# Patient Record
Sex: Male | Born: 1990 | Race: White | Hispanic: No | Marital: Single | Smoking: Current every day smoker
Health system: Southern US, Community
[De-identification: ages and names within clinical notes are randomized; demographics above are authoritative.]

## PROBLEM LIST (undated history)

## (undated) DIAGNOSIS — J939 Pneumothorax, unspecified: Secondary | ICD-10-CM

## (undated) HISTORY — DX: Pneumothorax, unspecified: J93.9

## (undated) HISTORY — PX: FRACTURE SURGERY: SHX138

---
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
## 2012-11-14 ENCOUNTER — Emergency Department: Payer: Self-pay | Admitting: Emergency Medicine

## 2014-09-16 ENCOUNTER — Emergency Department: Payer: Self-pay | Admitting: Emergency Medicine

## 2014-10-17 NOTE — Op Note (Signed)
PATIENT NAME:  Steven Rivers, Steven Rivers MR#:  [id number] DATE OF BIRTH:  1990/11/17  DATE OF PROCEDURE:  08/17/2011  PREOPERATIVE DIAGNOSIS: Right peritonsillar abscess.   POSTOPERATIVE DIAGNOSIS: Right peritonsillar abscess.  PROCEDURE: Incision and drainage of right peritonsillar abscess.   SURGEON: Marion DownerScott Karlynn Furrow, MD  ANESTHESIA: 1% lidocaine with epinephrine 1:100,000.   INDICATIONS: The patient has a right peritonsillar abscess unresponsive to medical management.   FINDINGS: I drained about 5 mL of purulent secretions from the right peritonsillar space.   COMPLICATIONS: None.   DESCRIPTION OF PROCEDURE: After discussing the procedure with the family, the throat was first anesthetized with topical Hurricaine spray followed by injection of 1% lidocaine with epinephrine 1:100,000. Initially I aspirated just above the right tonsil with an 18-gauge needle and obtained at least 2.5 mL of purulence. The area was then opened further with an 11 blade and quite a bit more purulence, at least 2.5 to 3 mL more, was subsequently drained. A needle driver was used to bluntly probe the peritonsillar space to make sure all of the loculation was broken up and the abscess successfully drained. Bleeding was minimal. The patient tolerated the procedure quite well and felt much better after drainage of the abscess.  ____________________________ Ollen GrossPaul S. Willeen CassBennett, MD psb:slb D: 08/17/2011 21:39:13 ET     T: 08/18/2011 10:41:47 ET       JOB#: [id number] cc: Ollen GrossPaul S. Willeen CassBennett, MD, <Dictator> Sandi MealyPAUL S Trula Frede MD ELECTRONICALLY SIGNED 08/27/2011 15:44

## 2014-10-17 NOTE — Consult Note (Signed)
PATIENT NAME:  Steven Rivers, [name] MR#:  [id number] DATE OF BIRTH:  1991/04/26  DATE OF CONSULTATION:  08/17/2011  REQUESTING PHYSICIAN:  Dr Enedina FinnerGoli CONSULTING PHYSICIAN:  Ollen GrossPaul S. Willeen CassBennett, MD  REASON FOR CONSULTATION: Peritonsillar abscess.   HISTORY OF PRESENT ILLNESS: This is an 24 year old male who has a history of a sore throat for the past week, initially evaluated by his primary care with evaluation for strep throat and mono. Both of those tests were negative but he was ultimately put on amoxicillin and steroids but continued to have worsening sore throat over the past 2 to 3 days with associated fever. He has really not had a history of recurrent strep throat, just occasional mild sore throats in the past.   PAST MEDICAL HISTORY: Otherwise unremarkable. No history of asthma, diabetes.   ALLERGIES: None.   MEDICATIONS:  Amoxicillin.   SOCIAL HISTORY: He is a nonsmoker. He is here with his family.   PAST SURGICAL HISTORY: Unremarkable.    REVIEW OF SYSTEMS: He is having right ear pain and difficulty swallowing, but he is able to drink liquids adequately, just difficulty eating solid foods since this started. Has not had any nausea, vomiting, diarrhea. He is not having any shortness of breath.   PHYSICAL EXAMINATION:  VITAL SIGNS: Temperature is 97.9, pulse 93, respirations 18, blood pressure is 112/75, oxygen saturations 100%.   GENERAL: Well-developed, well-nourished male in no acute distress other than looking uncomfortable.   HEENT:  Head and face: Head is normocephalic, atraumatic. No facial skin lesions. Facial strength is normal, symmetric. Ears: External ears, ear canals, tympanic membranes are clear bilaterally. There is no middle ear effusion or infection. Nasal exam: External nose is unremarkable. Nasal cavity is clear. No purulence or polyps are seen. The septum is straight. Oral cavity and oropharynx: Teeth, lips, and gums unremarkable. Tongue and floor of mouth without lesions.  Posterior pharynx reveals swelling of the right tonsil with deviation of the uvula towards the left and obvious swelling up onto the palate.   NECK: Neck is supple with some tender, nonfluctuant, jugulodigastric adenopathy on the right with about a 2 cm right cervical lymph node. No other adenopathy is palpable. Salivary glands are soft and nontender without masses. There is no thyromegaly.   NEUROLOGIC: Cranial nerves II through XII are grossly intact.   ASSESSMENT: This patient has right peritonsillar abscess.   PLAN: We went ahead and drained the abscess here in the emergency room.  I got at least 5 mL of purulence out and was able to obtain adequate drainage with reduction of the mass effect in his pharynx. He was actually feeling better immediately after the aspiration and drainage.   PLAN: He is able to swallow liquids fine and felt better after the drainage was performed. He was sent home on Augmentin 400 mg per 5 mL 2-1/4 teaspoon p.o. b.i.d. for 10 days, Sterapred DS 6-day taper, and Lortab to 15 mL p.o. every 4 to 6 hours as needed for pain. They are giving him a gram of Rocephin and dexamethasone IV prior to discharge. I think he should be able to swallow well now that he has had this drained. He has not had any issues of recurrent tonsillitis but if there are further issues that arise or he has problems with further peritonsillar abscess we could consider a tonsillectomy in the future.   ____________________________ Ollen GrossPaul S. Willeen CassBennett, MD psb:vtd D: 08/17/2011 21:37:10 ET T: 08/18/2011 10:39:21 ET JOB#: [id number]  cc: Ollen GrossPaul S. Willeen CassBennett,  MD, <Dictator> Sandi Mealy MD ELECTRONICALLY SIGNED 08/27/2011 15:44

## 2014-11-19 ENCOUNTER — Emergency Department: Payer: Managed Care, Other (non HMO)

## 2014-11-19 ENCOUNTER — Encounter: Payer: Self-pay | Admitting: Emergency Medicine

## 2014-11-19 ENCOUNTER — Inpatient Hospital Stay
Admission: EM | Admit: 2014-11-19 | Discharge: 2014-11-24 | DRG: 201 | Disposition: A | Discharge status: Home / Self Care | Payer: Managed Care, Other (non HMO) | Attending: Surgery | Admitting: Surgery

## 2014-11-19 DIAGNOSIS — J9311 Primary spontaneous pneumothorax: Principal | ICD-10-CM | POA: Diagnosis present

## 2014-11-19 DIAGNOSIS — R079 Chest pain, unspecified: Secondary | ICD-10-CM | POA: Diagnosis present

## 2014-11-19 DIAGNOSIS — J439 Emphysema, unspecified: Secondary | ICD-10-CM | POA: Diagnosis present

## 2014-11-19 DIAGNOSIS — F17210 Nicotine dependence, cigarettes, uncomplicated: Secondary | ICD-10-CM | POA: Diagnosis present

## 2014-11-19 DIAGNOSIS — F1290 Cannabis use, unspecified, uncomplicated: Secondary | ICD-10-CM | POA: Diagnosis present

## 2014-11-19 DIAGNOSIS — J939 Pneumothorax, unspecified: Secondary | ICD-10-CM | POA: Diagnosis not present

## 2014-11-19 DIAGNOSIS — J930 Spontaneous tension pneumothorax: Secondary | ICD-10-CM

## 2014-11-19 DIAGNOSIS — K838 Other specified diseases of biliary tract: Secondary | ICD-10-CM

## 2014-11-19 LAB — CBC
HEMATOCRIT: 47.1 % (ref 40.0–52.0)
HEMATOCRIT: 42.3 % (ref 40.0–52.0)
Hemoglobin: 15.8 g/dL (ref 13.0–18.0)
Hemoglobin: 14.4 g/dL (ref 13.0–18.0)
MCH: 30.8 pg (ref 26.0–34.0)
MCH: 31 pg (ref 26.0–34.0)
MCHC: 33.7 g/dL (ref 32.0–36.0)
MCHC: 34 g/dL (ref 32.0–36.0)
MCV: 91.6 fL (ref 80.0–100.0)
MCV: 91.2 fL (ref 80.0–100.0)
PLATELETS: 258 10*3/uL (ref 150–440)
PLATELETS: 198 10*3/uL (ref 150–440)
RBC: 5.14 MIL/uL (ref 4.40–5.90)
RBC: 4.63 MIL/uL (ref 4.40–5.90)
RDW: 13 % (ref 11.5–14.5)
RDW: 12.9 % (ref 11.5–14.5)
WBC: 9.5 10*3/uL (ref 3.8–10.6)
WBC: 15.6 10*3/uL — AB (ref 3.8–10.6)

## 2014-11-19 LAB — BASIC METABOLIC PANEL
ANION GAP: 9 (ref 5–15)
BUN: 11 mg/dL (ref 6–20)
CO2: 28 mmol/L (ref 22–32)
Calcium: 9.6 mg/dL (ref 8.9–10.3)
Chloride: 102 mmol/L (ref 101–111)
Creatinine, Ser: 1.02 mg/dL (ref 0.61–1.24)
GFR calc non Af Amer: >60 mL/min (ref >60)
Glucose, Bld: 111 mg/dL — ABNORMAL HIGH (ref 65–99)
POTASSIUM: 3.6 mmol/L (ref 3.5–5.1)
Sodium: 139 mmol/L (ref 135–145)

## 2014-11-19 LAB — CREATININE, SERUM
Creatinine, Ser: 0.91 mg/dL (ref 0.61–1.24)
GFR calc non Af Amer: >60 mL/min (ref >60)

## 2014-11-19 LAB — TROPONIN I: Troponin I: <0.03 ng/mL (ref <0.031)

## 2014-11-19 MED ORDER — BUPIVACAINE HCL 0.5 % IJ SOLN
10.0000 mL | Freq: Once | INTRAMUSCULAR | Status: AC
  Administered 2014-11-19: 10 mL

## 2014-11-19 MED ORDER — ACETAMINOPHEN 325 MG PO TABS: 650.0000 mg | ORAL_TABLET | Freq: Four times a day (QID) | ORAL | Status: DC | PRN

## 2014-11-19 MED ORDER — FENTANYL CITRATE (PF) 100 MCG/2ML IJ SOLN
INTRAMUSCULAR | Status: AC
  Filled 2014-11-19: qty 2

## 2014-11-19 MED ORDER — ACETAMINOPHEN 650 MG RE SUPP: 650.0000 mg | Freq: Four times a day (QID) | RECTAL | Status: DC | PRN

## 2014-11-19 MED ORDER — MIDAZOLAM HCL 2 MG/2ML IJ SOLN
2.0000 mg | Freq: Once | INTRAMUSCULAR | Status: AC
  Administered 2014-11-19: 2 mg via INTRAVENOUS

## 2014-11-19 MED ORDER — MORPHINE SULFATE 2 MG/ML IJ SOLN
2.0000 mg | INTRAMUSCULAR | Status: DC | PRN
  Administered 2014-11-20 (×4): 2 mg via INTRAVENOUS
  Administered 2014-11-20 (×2): 4 mg via INTRAVENOUS
  Administered 2014-11-20 – 2014-11-21 (×3): 2 mg via INTRAVENOUS
  Administered 2014-11-21: 4 mg via INTRAVENOUS
  Administered 2014-11-21: 2 mg via INTRAVENOUS
  Administered 2014-11-21: 1 mg via INTRAVENOUS
  Administered 2014-11-21: 4 mg via INTRAVENOUS
  Filled 2014-11-19 (×2): qty 1
  Filled 2014-11-19: qty 2
  Filled 2014-11-19: qty 1
  Filled 2014-11-19: qty 2
  Filled 2014-11-19 (×2): qty 1
  Filled 2014-11-19: qty 2
  Filled 2014-11-19 (×2): qty 1
  Filled 2014-11-19 (×2): qty 2
  Filled 2014-11-19: qty 1

## 2014-11-19 MED ORDER — DIPHENHYDRAMINE HCL 12.5 MG/5ML PO ELIX: 12.5000 mg | ORAL_SOLUTION | Freq: Four times a day (QID) | ORAL | Status: DC | PRN

## 2014-11-19 MED ORDER — HYDROMORPHONE HCL 1 MG/ML IJ SOLN
INTRAMUSCULAR | Status: AC
  Filled 2014-11-19: qty 1

## 2014-11-19 MED ORDER — FENTANYL CITRATE (PF) 100 MCG/2ML IJ SOLN
50.0000 ug | Freq: Once | INTRAMUSCULAR | Status: AC
  Administered 2014-11-19: 50 ug via INTRAVENOUS

## 2014-11-19 MED ORDER — HYDROMORPHONE HCL 1 MG/ML IJ SOLN
1.0000 mg | Freq: Once | INTRAMUSCULAR | Status: AC
  Administered 2014-11-19: 1 mg via INTRAVENOUS

## 2014-11-19 MED ORDER — LIDOCAINE-EPINEPHRINE (PF) 1 %-1:200000 IJ SOLN
INTRAMUSCULAR | Status: AC
  Filled 2014-11-19: qty 30

## 2014-11-19 MED ORDER — BUPIVACAINE HCL (PF) 0.5 % IJ SOLN
INTRAMUSCULAR | Status: AC
  Filled 2014-11-19: qty 30

## 2014-11-19 MED ORDER — HYDROMORPHONE HCL 1 MG/ML IJ SOLN
0.5000 mg | Freq: Once | INTRAMUSCULAR | Status: AC
  Administered 2014-11-19: 0.5 mg via INTRAVENOUS

## 2014-11-19 MED ORDER — LIDOCAINE-EPINEPHRINE (PF) 2 %-1:200000 IJ SOLN
10.0000 mL | Freq: Once | INTRAMUSCULAR | Status: AC
  Administered 2014-11-19: 10 mL via INTRADERMAL

## 2014-11-19 MED ORDER — FENTANYL CITRATE (PF) 100 MCG/2ML IJ SOLN
50.0000 ug | Freq: Once | INTRAMUSCULAR | Status: AC
Start: 2014-11-19 — End: 2014-11-19
  Administered 2014-11-19: 50 ug via INTRAVENOUS

## 2014-11-19 MED ORDER — HYDROMORPHONE HCL 1 MG/ML IJ SOLN
INTRAMUSCULAR | Status: AC
  Administered 2014-11-19: 1 mg via INTRAVENOUS
  Filled 2014-11-19: qty 1

## 2014-11-19 MED ORDER — MIDAZOLAM HCL 5 MG/5ML IJ SOLN
INTRAMUSCULAR | Status: AC
  Filled 2014-11-19: qty 5

## 2014-11-19 MED ORDER — ENOXAPARIN SODIUM 40 MG/0.4ML ~~LOC~~ SOLN
40.0000 mg | SUBCUTANEOUS | Status: DC
  Administered 2014-11-20 – 2014-11-22 (×3): 40 mg via SUBCUTANEOUS
  Filled 2014-11-19 (×3): qty 0.4

## 2014-11-19 MED ORDER — DIPHENHYDRAMINE HCL 50 MG/ML IJ SOLN: 12.5000 mg | Freq: Four times a day (QID) | INTRAMUSCULAR | Status: DC | PRN

## 2014-11-19 NOTE — H&P (Signed)
Steven Rivers is an 24 y.o. male.    Chief Complaint:   Left chest pain and SOB  HPI:   24 year old smoker presents with acute onset of left chest pain while walking his dog.  No trauma reported.  No prior symptoms as such.  He smokes 1 pack of cigarettes per day as well as does cannabinoids. Denies any prior history of this. He is accompanied by his parents. A chest x-ray in the emergency room demonstrates arch left pneumothorax without midline shift. Patient denies any fevers hemoptysis anorexia loss of consciousness or fall.  History reviewed. No pertinent past medical history.  Past Surgical History  Procedure Laterality Date  . Fracture surgery      History reviewed. No pertinent family history. Social History:  reports that he has been smoking.  He has never used smokeless tobacco. He reports that he drinks alcohol. He reports that he uses illicit drugs (Marijuana).  Allergies: No Known Allergies   Review of Systems:   Review of Systems  Constitutional: Negative for fever, chills, weight loss, malaise/fatigue and diaphoresis.  Respiratory: Positive for shortness of breath. Negative for hemoptysis, sputum production and wheezing.   Cardiovascular: Negative.   Gastrointestinal: Negative.   Skin: Negative for itching and rash.  Neurological: Negative.  Negative for weakness.  Psychiatric/Behavioral: Negative.   All other systems reviewed and are negative.   Physical Exam:  Physical Exam  Constitutional: He is oriented to person, place, and time and well-developed, well-nourished, and in no distress. No distress.  HENT:  Head: Normocephalic.  Eyes: Conjunctivae are normal. Pupils are equal, round, and reactive to light.  Neck: Normal range of motion. Neck supple. No JVD present. No tracheal deviation present. No thyromegaly present.  Cardiovascular: Normal rate and regular rhythm.   Pulmonary/Chest: Effort normal. No respiratory distress. He has wheezes. He has no  rales. He exhibits no tenderness.  Left chest tube in place placed by ER staff prior to my arrival.   Small intermittent Air leak is present.  Abdominal: Soft.  Musculoskeletal: Normal range of motion.  Lymphadenopathy:    He has no cervical adenopathy.  Neurological: He is alert and oriented to person, place, and time.  Skin: Skin is dry. He is not diaphoretic.  Psychiatric: Memory, affect and judgment normal.    Blood pressure 121/74, pulse 88, temperature 98 F (36.7 C), temperature source Oral, resp. rate 14, height 5' 10"  (1.778 m), weight 63.504 kg (140 lb), SpO2 99 %.    Results for orders placed or performed during the hospital encounter of 11/19/14 (from the past 48 hour(s))  CBC     Status: None   Collection Time: 11/19/14  2:26 PM  Result Value Ref Range   WBC 9.5 3.8 - 10.6 K/uL   RBC 5.14 4.40 - 5.90 MIL/uL   Hemoglobin 15.8 13.0 - 18.0 g/dL   HCT 47.1 40.0 - 52.0 %   MCV 91.6 80.0 - 100.0 fL   MCH 30.8 26.0 - 34.0 pg   MCHC 33.7 32.0 - 36.0 g/dL   RDW 13.0 11.5 - 14.5 %   Platelets 258 150 - 440 K/uL  Troponin I     Status: None   Collection Time: 11/19/14  2:26 PM  Result Value Ref Range   Troponin I <0.03 <0.031 ng/mL    Comment:        NO INDICATION OF MYOCARDIAL INJURY.   Basic metabolic panel     Status: Abnormal   Collection Time:  11/19/14  2:26 PM  Result Value Ref Range   Sodium 139 135 - 145 mmol/L   Potassium 3.6 3.5 - 5.1 mmol/L   Chloride 102 101 - 111 mmol/L   CO2 28 22 - 32 mmol/L   Glucose, Bld 111 (H) 65 - 99 mg/dL   BUN 11 6 - 20 mg/dL   Creatinine, Ser 1.02 0.61 - 1.24 mg/dL   Calcium 9.6 8.9 - 10.3 mg/dL   GFR calc non Af Amer >60 >60 mL/min   GFR calc Af Amer >60 >60 mL/min    Comment: (NOTE) The eGFR has been calculated using the CKD EPI equation. This calculation has not been validated in all clinical situations. eGFR's persistently <60 mL/min signify possible Chronic Kidney Disease.    Anion gap 9 5 - 15   Dg Chest 2  View  11/19/2014   CLINICAL DATA:  Acute left-sided chest pain and cough  EXAM: CHEST  2 VIEW  COMPARISON:  August 17, 2011  FINDINGS: There is complete collapse of the left lung without appreciable tension component. The right lung is clear. The heart size and pulmonary vascularity are normal. No adenopathy. There is an old healed fracture of the left clavicle.  IMPRESSION: Complete pneumothorax on the left.  Critical Value/emergent results were called by telephone at the time of interpretation on 11/19/2014 at 3:24 pm to Dr. Harvest Dark , who verbally acknowledged these results.   Electronically Signed   By: Lowella Grip III M.D.   On: 11/19/2014 15:24   Dg Chest Portable 1 View  11/19/2014   CLINICAL DATA:  Status post chest tube insertion  EXAM: PORTABLE CHEST - 1 VIEW  COMPARISON:  Earlier today  FINDINGS: Interval placement of left chest tube. There has been significant improvement in the appearance of the left pneumothorax. Residual left apical component measures 1.6 cm in thickness. Atelectasis is noted in left midlung. The right lung is clear.  IMPRESSION: Significant interval re-expansion of the left lung status post chest tube insertion. There is a residual left apical pneumothorax measuring 1.6 cm.   Electronically Signed   By: Kerby Moors M.D.   On: 11/19/2014 16:31    I personally reviewed the images on PACS monitor and spoke with Dr. Rip Harbour of the emergency room regarding the patient's care. Dr. Cinda Quest placed chest tube.    Assessment/Plan 24 year old pack per day cigarette smoker and cannabinoid user presents with spontaneous left pneumothorax which has been treated with a 20 French chest tube placed by the emergency room staff. I discussed with him the need for smoking cessation as well as avoidance of deep inhalation with cannabinoids use in the future. I discussed with him the surgical plan of chest tube drainage and reexpansion of his lung with chest tube drainage  alone. I discussed with him the small risk of bilaterality in this process. As well as a small risk of recurrence. Plan is for chest tube placement continued suction follow-up chest x-ray in the morning and resolution of his air leak. I discussed with him and his parents briefly that it clearly did not resolve that he would need a thoracoscopic procedure next week.   Hortencia Conradi, MD, FACS

## 2014-11-19 NOTE — ED Notes (Signed)
Pt in bed with father at bedside, pt with c/o chest pain at CT site.

## 2014-11-19 NOTE — ED Notes (Signed)
Patient reports some increased muscle type pain to left rib area, discussed that this is likely related to placement of chest tube. Patient reports no increased shortness of breath, oxygen saturations WNL. Encouraged patient to call if pain worsens. Dr. Darnelle CatalanMalinda aware.

## 2014-11-19 NOTE — ED Notes (Signed)
Pt in bed with no complaints, family at bedside.

## 2014-11-19 NOTE — ED Provider Notes (Addendum)
[facility] Emergency Department Provider Note  ____________________________________________  Time seen: Approximately 4:14 PM  I have reviewed the triage vital signs and the nursing notes.   HISTORY  Chief Complaint Chest Pain and Back Pain   HPI Steven Rivers is a 24 y.o. male patient reports he got up was fine this morning and began to walk the dog around 1:00 developed sudden onset of chest pain and back pain worse with movement or coughing. Patient got somewhat short of breath. Came into the emergency room  Reports he's never had this before he has no past medical history no allergies and no other problems History reviewed. No pertinent past medical history.  There are no active problems to display for this patient.   Past Surgical History  Procedure Laterality Date  . Fracture surgery      No current outpatient prescriptions on file.  Allergies Review of patient's allergies indicates no known allergies.  No family history on file.  Social History History  Substance Use Topics  . Smoking status: Current Every Day Smoker  . Smokeless tobacco: Never Used  . Alcohol Use: Yes    Review of Systems Constitutional: No fever/chills Eyes: No visual changes. ENT: No sore throat. Cardiovascular: Sided chest pain Respiratory:shortness of breath. Gastrointestinal: No abdominal pain.  No nausea, no vomiting.  No diarrhea.  No constipation. Genitourinary: Negative for dysuria. Musculoskeletal: Negative for back pain. Skin: Negative for rash. Neurological: Negative for headaches, focal weakness or numbness.  10-point ROS otherwise negative.  ____________________________________________   PHYSICAL EXAM:  VITAL SIGNS: ED Triage Vitals  Enc Vitals Group     BP 11/19/14 1433 141/81 mmHg     Pulse Rate 11/19/14 1433 112     Resp 11/19/14 1433 18     Temp 11/19/14 1433 98 F (36.7 C)     Temp Source 11/19/14 1433 Oral     SpO2 11/19/14  1433 98 %     Weight 11/19/14 1433 140 lb (63.504 kg)     Height 11/19/14 1433  (1.778 m)     Head Cir --      Peak Flow --      Pain Score 11/19/14 1434 10     Pain Loc --      Pain Edu? --      Excl. in GC? --     Constitutional: Alert and oriented. Eyes: Conjunctivae are normal. PERRL. EOMI. Head: Atraumatic. Nose: No congestion/rhinnorhea. Mouth/Throat: Mucous membranes are moist.  Oropharynx non-erythematous. Neck: No stridor. Trachea is midline  Cardiovascular: Normal rate, regular rhythm. Grossly normal heart sounds.  Good peripheral circulation. Respiratory: Normal respiratory effort.  No retractions. Right lung is clear there are no breath sounds on the left is some tympany on the left however Gastrointestinal: Soft and nontender. No distention. No abdominal bruits. No CVA tenderness. Musculoskeletal: No lower extremity tenderness nor edema.  No joint effusions. Neurologic:  Normal speech and language. No gross focal neurologic deficits are appreciated. Speech is normal. No gait instability. Skin:  Skin is warm, dry and intact. No rash noted. Psychiatric: Mood and affect are normal. Speech and behavior are normal.  ____________________________________________   LABS (all labs ordered are listed, but only abnormal results are displayed)  Labs Reviewed  BASIC METABOLIC PANEL - Abnormal; Notable for the following:    Glucose, Bld 111 (*)    All other components within normal limits  CBC  TROPONIN I   ____________________________________________  EKG EKG read by me  says shows normal sinus rhythm at a rate of 100 normal axis no acute ST-T wave changes patient normal EKG ____________________________________________  RADIOLOGY  Chest x-ray read by me shows a massive left-sided pneumothorax with complete collapse of the left lung does not appear to be tension yet at this point ____________________________________________   PROCEDURES  Procedure(s) performed:  Consent was obtained patient prepped in usual sterile fashion patient was given Versed and fentanyl 50 mg that no one 2 mg of Versed twice IV in the left chest midaxillary line about the fifth interspace was anesthetized with a 50-50 mixture of 10 cc of 1% lidocaine with epi and bupivacaine late large wheal was raised. Skin was incised with a #11 blade and then a 20 French chest tube over trocar was inserted easily into the incision patient coughed and there were several large rushes of air every time he coughed coming out of the chest tube there was no bleeding. Tube was sewn into place and a pursestring suture was left in place around the chest tube site to be used if needed patient tolerated procedure quite well  Critical Care performed: No  ____________________________________________   INITIAL IMPRESSION / ASSESSMENT AND PLAN / ED COURSE  Pertinent labs & imaging results that were available during my care of the patient were reviewed by me and considered in my medical decision making (see chart for details).   ____________________________________________   FINAL CLINICAL IMPRESSION(S) / ED DIAGNOSES  Final diagnoses:  Pneumothorax     Arnaldo NatalPaul F Skilynn Durney, MD 11/19/14 1621  EP chest x-ray shows significant reexpansion lung small apical pneumothorax remaining and some atelectasis in the midlung patient continues to complain of some pain in the left chest but this is under control with pain meds  Arnaldo NatalPaul F Dellene Mcgroarty, MD 11/19/14 [phone]

## 2014-11-19 NOTE — ED Notes (Signed)
Transparent gauze and paper tape dressing placed around chest tube by Dr. Darnelle CatalanMalinda. Patient tolerating well.

## 2014-11-19 NOTE — ED Notes (Signed)
Chest tube properly working at this time

## 2014-11-19 NOTE — ED Notes (Signed)
Patient reports sudden onset of chest pain and back pain today while walking dog. Patient reports diaphoresis and nausea.

## 2014-11-20 ENCOUNTER — Inpatient Hospital Stay: Payer: Managed Care, Other (non HMO)

## 2014-11-20 MED ORDER — PROMETHAZINE HCL 25 MG PO TABS
12.5000 mg | ORAL_TABLET | ORAL | Status: DC | PRN
  Administered 2014-11-21 – 2014-11-23 (×3): 12.5 mg via ORAL
  Filled 2014-11-20 (×3): qty 1

## 2014-11-20 MED ORDER — MORPHINE SULFATE 4 MG/ML IJ SOLN
8.0000 mg | Freq: Once | INTRAMUSCULAR | Status: AC
Start: 2014-11-20 — End: 2014-11-20
  Administered 2014-11-20: 8 mg via INTRAVENOUS
  Filled 2014-11-20: qty 2

## 2014-11-20 MED ORDER — HYDROCODONE-ACETAMINOPHEN 5-325 MG PO TABS
1.0000 | ORAL_TABLET | ORAL | Status: DC | PRN
  Administered 2014-11-20 (×2): 1 via ORAL
  Administered 2014-11-21 – 2014-11-22 (×5): 2 via ORAL
  Filled 2014-11-20: qty 2
  Filled 2014-11-20: qty 1
  Filled 2014-11-20 (×3): qty 2
  Filled 2014-11-20: qty 1
  Filled 2014-11-20: qty 2

## 2014-11-20 MED ORDER — PROMETHAZINE HCL 25 MG/ML IJ SOLN
12.5000 mg | INTRAMUSCULAR | Status: DC | PRN
  Administered 2014-11-20 – 2014-11-21 (×3): 12.5 mg via INTRAVENOUS
  Filled 2014-11-20 (×3): qty 1

## 2014-11-20 MED ORDER — OXYCODONE-ACETAMINOPHEN 5-325 MG PO TABS
1.0000 | ORAL_TABLET | ORAL | Status: DC | PRN
  Administered 2014-11-21: 2 via ORAL
  Administered 2014-11-21: 1 via ORAL
  Administered 2014-11-22 – 2014-11-24 (×10): 2 via ORAL
  Filled 2014-11-20 (×6): qty 2
  Filled 2014-11-20: qty 1
  Filled 2014-11-20 (×5): qty 2

## 2014-11-20 NOTE — Progress Notes (Signed)
[facility]   PATIENT NAME: Steven Hauserlijah Golden    MR#:  [id number]  DATE OF BIRTH:  07/12/1990  SUBJECTIVE:   No pain,  Poor cough,  Dipping smokeless tobacco. CXR shows small apical PTX,  Xray taken off suction (-20 CM).  REVIEW OF SYSTEMS:   Review of Systems  Constitutional: Negative.   Cardiovascular: Positive for chest pain.  All other systems reviewed and are negative.   DRUG ALLERGIES:  No Known Allergies  VITALS:  Blood pressure 121/71, pulse 60, temperature 98.4 F (36.9 C), temperature source Oral, resp. rate 17, height 5\' 10"  (1.778 m), weight 58.196 kg (128 lb 4.8 oz), SpO2 100 %.  PHYSICAL EXAMINATION:  GENERAL:  24 y.o.-year-old patient lying in the bed with no acute distress.  EYES: Pupils equal, round, reactive to light and accommodation. No scleral icterus. Extraocular muscles intact.  HEENT: Head atraumatic, normocephalic. Oropharynx and nasopharynx clear.  NECK:  Supple, no jugular venous distention. No thyroid enlargement, no tenderness.  LUNGS: Normal breath sounds bilaterally, no wheezing, rales,rhonchi or crepitation. No use of accessory muscles of respiration. Small air leak noted,  CARDIOVASCULAR: S1, S2 normal. No murmurs, rubs, or gallops.  ABDOMEN: Soft, nontender, nondistended. Bowel sounds present. No organomegaly or mass.  EXTREMITIES: No pedal edema, cyanosis, or clubbing.  NEUROLOGIC: Cranial nerves II through XII are intact. Muscle strength 5/5 in all extremities. Sensation intact. Gait not checked.  PSYCHIATRIC: The patient is alert and oriented x 3.  SKIN: No obvious rash, lesion, or ulcer.     ASSESSMENT AND PLAN:   I personally reviewed the chest x-ray images on the PACS monitor. There is a small left apical pneumothorax. Chest tube in good position.  24 year old male with tobacco abuse and dependence in addition to smokeless tobacco dependence with spontaneous left pneumothorax. Small apical pneumothorax is noted. Chest tube  suction was increased from -20 to -30 at the bedside. We will recheck an x-ray in the morning and asked Dr. Thelma Bargeaks to see the patient on Monday.  He voiced understanding.

## 2014-11-20 NOTE — Progress Notes (Signed)
Dr. Anda KraftMarterre notified of pt having increased pain at chest tube site even after receiving morphine 30 min ago and "squealing" noise heard at chest tube site as well. Continues to have bubbling in chest tube as well with suction increased to 30cm. MD stated he will come see patient.

## 2014-11-20 NOTE — Progress Notes (Signed)
Asked Dr. Egbert GaribaldiBird about patient being able to ambulate without suction to the chest tube.  He said that was ok but only for a short time, must stay on the floor

## 2014-11-20 NOTE — Progress Notes (Signed)
Persistent air leak with squeak at the chest tube insertion site at the skin. I removed the dressing and replaced it. Although the squeak is markedly improved, there is still a small squeak within the tubing, but it is not from the tubing itself and therefore must be from the lung. Percocet and Norco prescribed for pain.

## 2014-11-21 ENCOUNTER — Inpatient Hospital Stay: Payer: Managed Care, Other (non HMO)

## 2014-11-21 MED ORDER — MORPHINE SULFATE 4 MG/ML IJ SOLN
5.0000 mg | INTRAMUSCULAR | Status: DC | PRN
Start: 2014-11-21 — End: 2014-11-22
  Administered 2014-11-21: 2 mg via INTRAVENOUS
  Administered 2014-11-22: 8 mg via INTRAVENOUS
  Administered 2014-11-22 (×2): 4 mg via INTRAVENOUS
  Filled 2014-11-21: qty 1
  Filled 2014-11-21: qty 2
  Filled 2014-11-21: qty 1

## 2014-11-21 MED ORDER — MORPHINE SULFATE 4 MG/ML IJ SOLN
INTRAMUSCULAR | Status: AC
  Filled 2014-11-21: qty 1

## 2014-11-21 NOTE — Progress Notes (Signed)
Notified Dr Anda KraftMarterre of pt pain severe sharpe stabbing pain left flank-chest tube, blood pressure elevated. No change in lung sounds or CT status. New medication parameters ordered.

## 2014-11-21 NOTE — Progress Notes (Signed)
[facility]   PATIENT NAME: Steven Rivers    MR#:  [id number]  DATE OF BIRTH:  Jun 27, 1990  SUBJECTIVE:   He was sleeping. Last night's air leak was noted by Dr. Anda KraftMarterre.  Today's chest x-ray demonstrates diminished pneumothorax. He is continuing to have an air leak. REVIEW OF SYSTEMS:   Review of Systems  Constitutional: Negative for fever and chills.  Respiratory: Negative for cough, hemoptysis, sputum production and shortness of breath.   Cardiovascular: Negative for chest pain and palpitations.  Skin: Negative for rash.  Neurological: Negative for headaches.  All other systems reviewed and are negative.   DRUG ALLERGIES:  No Known Allergies  VITALS:  Blood pressure 110/62, pulse 64, temperature 98.3 F (36.8 C), temperature source Oral, resp. rate 18, height 5\' 10"  (1.778 m), weight 58.196 kg (128 lb 4.8 oz), SpO2 99 %.  PHYSICAL EXAMINATION:  GENERAL:  24 y.o.-year-old patient lying in the bed with no acute distress.  EYES: Pupils equal, round, reactive to light and accommodation. No scleral icterus. Extraocular muscles intact.  HEENT: Head atraumatic, normocephalic. Oropharynx and nasopharynx clear.  NECK:  Supple, no jugular venous distention. No thyroid enlargement, no tenderness.  LUNGS: Normal breath sounds bilaterally, no wheezing, rales,rhonchi or crepitation. No use of accessory muscles of respiration. There is a small intermittent air leak. Left chest tube dressing appears to be intact with no subcutaneous emphysema noted. CARDIOVASCULAR: S1, S2 normal. No murmurs, rubs, or gallops.  ABDOMEN: Soft, nontender, nondistended. Bowel sounds present. No organomegaly or mass.  EXTREMITIES: No pedal edema, cyanosis, or clubbing.  NEUROLOGIC: Cranial nerves II through XII are intact. Muscle strength 5/5 in all extremities. Sensation intact. Gait not checked.  PSYCHIATRIC: The patient is alert and oriented x 3.  SKIN: No obvious rash, lesion, or ulcer.      ASSESSMENT AND PLAN:   I personally reviewed the chest x-ray from today and yesterday. There is a tiny left apical pneumothorax no subcutaneous emphysema noted. The chest tube was in good position.  Hospital day #2 status post spontaneous left pneumothorax and chest tube placement by the emergency room staff. We will obtain thoracic surgery consultation Monday. I will decrease his suction to -20 cm of wall suction at this time. Discussed briefly the possibility of needing operative intervention given the persistent air leak. Again I discussed the importance of smoking cessation.

## 2014-11-22 ENCOUNTER — Inpatient Hospital Stay: Payer: Managed Care, Other (non HMO)

## 2014-11-22 LAB — COMPREHENSIVE METABOLIC PANEL
ALK PHOS: 76 U/L (ref 38–126)
ALT: 13 U/L — ABNORMAL LOW (ref 17–63)
ANION GAP: 6 (ref 5–15)
AST: 19 U/L (ref 15–41)
Albumin: 4.1 g/dL (ref 3.5–5.0)
BILIRUBIN TOTAL: 0.2 mg/dL — AB (ref 0.3–1.2)
BUN: 10 mg/dL (ref 6–20)
CHLORIDE: 96 mmol/L — AB (ref 101–111)
CO2: 30 mmol/L (ref 22–32)
CREATININE: 0.94 mg/dL (ref 0.61–1.24)
Calcium: 8.5 mg/dL — ABNORMAL LOW (ref 8.9–10.3)
GFR calc Af Amer: >60 mL/min (ref >60)
GFR calc non Af Amer: >60 mL/min (ref >60)
Glucose, Bld: 75 mg/dL (ref 65–99)
POTASSIUM: 3.7 mmol/L (ref 3.5–5.1)
Sodium: 132 mmol/L — ABNORMAL LOW (ref 135–145)
Total Protein: 6.8 g/dL (ref 6.5–8.1)

## 2014-11-22 LAB — CBC WITH DIFFERENTIAL/PLATELET
BASOS ABS: 0.1 10*3/uL (ref 0–0.1)
BASOS PCT: 1 %
EOS PCT: 4 %
Eosinophils Absolute: 0.3 10*3/uL (ref 0–0.7)
HCT: 41.9 % (ref 40.0–52.0)
Hemoglobin: 14.4 g/dL (ref 13.0–18.0)
LYMPHS ABS: 3.1 10*3/uL (ref 1.0–3.6)
LYMPHS PCT: 42 %
MCH: 31.2 pg (ref 26.0–34.0)
MCHC: 34.4 g/dL (ref 32.0–36.0)
MCV: 90.6 fL (ref 80.0–100.0)
Monocytes Absolute: 0.5 10*3/uL (ref 0.2–1.0)
Monocytes Relative: 7 %
NEUTROS PCT: 46 %
Neutro Abs: 3.5 10*3/uL (ref 1.4–6.5)
Platelets: 178 10*3/uL (ref 150–440)
RBC: 4.62 MIL/uL (ref 4.40–5.90)
RDW: 12.9 % (ref 11.5–14.5)
WBC: 7.4 10*3/uL (ref 3.8–10.6)

## 2014-11-22 LAB — PROTIME-INR
INR: 0.98
Prothrombin Time: 13.2 seconds (ref 11.4–15.0)

## 2014-11-22 LAB — APTT: APTT: 34 s (ref 24–36)

## 2014-11-22 MED ORDER — MORPHINE SULFATE 4 MG/ML IJ SOLN
4.0000 mg | INTRAMUSCULAR | Status: DC | PRN
  Administered 2014-11-21: 22:00:00 via INTRAVENOUS
  Administered 2014-11-22: 6 mg via INTRAVENOUS
  Administered 2014-11-22: 4 mg via INTRAVENOUS
  Administered 2014-11-22: 6 mg via INTRAVENOUS
  Administered 2014-11-22 – 2014-11-23 (×4): 4 mg via INTRAVENOUS
  Administered 2014-11-23: 6 mg via INTRAVENOUS
  Administered 2014-11-23 (×2): 4 mg via INTRAVENOUS
  Administered 2014-11-23: 6 mg via INTRAVENOUS
  Filled 2014-11-22: qty 1
  Filled 2014-11-22: qty 2
  Filled 2014-11-22: qty 1
  Filled 2014-11-22: qty 2
  Filled 2014-11-22: qty 1
  Filled 2014-11-22: qty 2
  Filled 2014-11-22: qty 1
  Filled 2014-11-22: qty 2
  Filled 2014-11-22 (×3): qty 1

## 2014-11-22 MED ORDER — CEFAZOLIN SODIUM-DEXTROSE 2-3 GM-% IV SOLR
2.0000 g | INTRAVENOUS | Status: AC
  Filled 2014-11-22: qty 50

## 2014-11-22 NOTE — Progress Notes (Signed)
Subjective:   Sleeping no new symptoms.  Vital signs in last 24 hours: Temp:  [97.8 F (36.6 C)-98.3 F (36.8 C)] 97.8 F (36.6 C) (05/30 0844) Pulse Rate:  [59-76] 59 (05/30 0844) Resp:  [16-20] 18 (05/30 0008) BP: (112-158)/(61-89) 112/64 mmHg (05/30 0844) SpO2:  [100 %] 100 % (05/30 0844) Last BM Date: 11/21/14  Intake/Output from previous day: 05/29 0701 - 05/30 0700 In: 580 [P.O.:580] Out: 1610 [Urine:1575; Chest Tube:35]  Exam:  Continues to have chest pain.  Lab Results:  CBC  Recent Labs  11/19/14 1426 11/19/14 2306  WBC 9.5 15.6*  HGB 15.8 14.4  HCT 47.1 42.3  PLT 258 198   CMP     Component Value Date/Time   NA 139 11/19/2014 1426   NA 143* 08/17/2011 1957   K 3.6 11/19/2014 1426   K 4.0 08/17/2011 1957   CL 102 11/19/2014 1426   CL 102 08/17/2011 1957   CO2 28 11/19/2014 1426   CO2 31* 08/17/2011 1957   GLUCOSE 111* 11/19/2014 1426   GLUCOSE 83 08/17/2011 1957   BUN 11 11/19/2014 1426   BUN 6* 08/17/2011 1957   CREATININE 0.91 11/19/2014 2306   CREATININE 0.83 08/17/2011 1957   CALCIUM 9.6 11/19/2014 1426   CALCIUM 8.6* 08/17/2011 1957   GFRNONAA >60 11/19/2014 2306   GFRAA >60 11/19/2014 2306   PT/INR No results for input(s): LABPROT, INR in the last 72 hours.  Studies/Results: Dg Chest 1 View  11/21/2014   CLINICAL DATA:  Followup pneumothorax.  EXAM: CHEST  1 VIEW  COMPARISON:  11/20/2014.  FINDINGS: Left pneumothorax is decreased in size, follow-up measuring 14 mm from the apical parietal pleural margin where it had measured 21 mm.  Left chest tube is stable.  Lungs are hyperexpanded. No lung consolidation or edema. No pleural effusion.  IMPRESSION: 1. Small left apical pneumothorax is decreased in size from the previous day's study. No other change.   Electronically Signed   By: Amie Portlandavid  Ormond M.D.   On: 11/21/2014 08:31    Assessment/Plan: Moderate air leak persists. Chest x-rays pending.

## 2014-11-22 NOTE — Progress Notes (Addendum)
Steven Marin, MD Patient ID: Steven Rivers, male   DOB: 09/13/1990, 24 y.o.   MRN: [id number]  Chief Complaint  Patient presents with  . Chest Pain  . Back Pain    HPI Location, Quality, Duration, Severity, Timing, Context, Modifying Factors, Associated Signs and Symptoms.  Steven Rivers is a 24 y.o. male.  I have been asked to see this patient in consultation regarding his left spontaneous pneumothorax by Dr. Natale Lay. This patient is a 24 year old white male who was walking his dog down the steps when he developed the acute onset of left-sided chest pain with shortness of breath. The pain was severe and it was associated with sweating. There was no nausea or vomiting fevers or chills. There is no cough or sputum production. The patient presented to the emergency department where a chest x-ray was performed. This revealed a left-sided pneumothorax which is managed with a tube thoracostomy. The patient states that he had prompt relief of the shortness of breath but continues to have pain at the chest tube insertion site. Over the last several days he has had a moderate sized air leak and his chest x-rays have confirmed a small apical pneumothorax which has been persistent. He has been tried on various degrees of suction and still his air leak persists. He states that he is not short of breath at the present time but does have some chest discomfort. This is made worse with coughing and sneezing. There are no other associated symptoms at this point. He states that he has had chest pain off and on over the years but never like this and it has always lasted only a few minutes at most. There is no family history of lung disease although his grandfather had lung cancer. He denies any prior pneumothorax. These had some orthopedic surgical procedures but has never had any chest tubes or lung surgery.  History reviewed. No pertinent past medical history.  Past Surgical History  Procedure Laterality Date   . Fracture surgery      History reviewed. No pertinent family history.  Social History History  Substance Use Topics  . Smoking status: Current Every Day Smoker  . Smokeless tobacco: Never Used  . Alcohol Use: Yes    No Known Allergies  Current Facility-Administered Medications  Medication Dose Route Frequency Provider Last Rate Last Dose  . acetaminophen (TYLENOL) tablet 650 mg  650 mg Oral Q6H PRN Duwaine Maxin, MD       Or  . acetaminophen (TYLENOL) suppository 650 mg  650 mg Rectal Q6H PRN Duwaine Maxin, MD      . diphenhydrAMINE (BENADRYL) injection 12.5 mg  12.5 mg Intravenous Q6H PRN Duwaine Maxin, MD       Or  . diphenhydrAMINE (BENADRYL) 12.5 MG/5ML elixir 12.5 mg  12.5 mg Oral Q6H PRN Duwaine Maxin, MD      . enoxaparin (LOVENOX) injection 40 mg  40 mg Subcutaneous Q24H Duwaine Maxin, MD   40 mg at 11/22/14 1051  . HYDROcodone-acetaminophen (NORCO/VICODIN) 5-325 MG per tablet 1-2 tablet  1-2 tablet Oral Q4H PRN Duwaine Maxin, MD   2 tablet at 11/22/14 1048  . morphine 4 MG/ML injection 5-10 mg  5-10 mg Intravenous Q1H PRN Duwaine Maxin, MD   4 mg at 11/22/14 0836  . oxyCODONE-acetaminophen (PERCOCET/ROXICET) 5-325 MG per tablet 1-2 tablet  1-2 tablet Oral Q4H PRN Duwaine Maxin, MD   2 tablet at 11/22/14 [id number]  . promethazine (PHENERGAN) tablet 12.5 mg  12.5 mg  Oral Q3H PRN Duwaine MaxinWilliam Marterre, MD   12.5 mg at 11/21/14 1943   Or  . promethazine (PHENERGAN) injection 12.5 mg  12.5 mg Intravenous Q3H PRN Duwaine MaxinWilliam Marterre, MD   12.5 mg at 11/21/14 0725      Review of Systems A 10 point review of systems was asked and was negative except for the following positive findings - none except for pain at the chest tube insertion site.  Blood pressure 112/64, pulse 59, temperature 97.8 F (36.6 C), temperature source Oral, resp. rate 18, height 5\' 10"  (1.778 m), weight 58.196 kg (128 lb 4.8 oz), SpO2 100 %.  Physical Exam CONSTITUTIONAL:  Pleasant,  well-developed, well-nourished, and in no acute distress. EYES: Pupils equal and reactive to light, Sclera non-icteric EARS, NOSE, MOUTH AND THROAT:  The oropharynx was clear.  Dentition is good repair.  Oral mucosa pink and moist. LYMPH NODES:  Lymph nodes in the neck and axillae were normal RESPIRATORY:  Lungs were clear.  Normal respiratory effort without pathologic use of accessory muscles of respiration CARDIOVASCULAR: Heart was regular without murmurs.  There were no carotid bruits. GI: The abdomen was soft, nontender, and nondistended. There were no palpable masses. There was no hepatosplenomegaly. There were normal bowel sounds in all quadrants. GU:  Rectal deferred.   MUSCULOSKELETAL:  Normal muscle strength and tone.  No clubbing or cyanosis.   SKIN:  There were no pathologic skin lesions.  There were no nodules on palpation.  There is a bulky dressing present overlying the chest tube. The patient does have multiple tattoos. NEUROLOGIC:  Cranial nerves are grossly intact. PSYCH:  Oriented to person, place and time.  Mood and affect are normal.  Data Reviewed I have personally reviewed the patient's chest x-rays.  I have personally reviewed the patient's imaging, laboratory findings and medical records.    Assessment    I had a long discussion with the patient and his father today. His girlfriend was also present. Patient is quite desirous of discharge from the hospital soon as possible. I reviewed with him the most likely etiology including apical bleb disease given his young age. In fact on the chest x-ray I do believe that I can see some of the blebs. There is no indication of any other pathology present. 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    Plan    I spent a long time with the patient and his father today reviewing the options. I told them that for the first occurrence we usually manages this with a chest tube alone. However  he continues to have a moderate sized air leak and is quite anxious have this taken care of in a more definitive fashion and in a timely fashion. I explained to him the indications and risks of thoracoscopy possible thoracotomy with blebectomy and mechanical pleurodesis. I quoted them a 10% recurrence rate with chest tube alone versus a 3-4% recurrence rate with blebectomy and mechanical pleurodesis. We also reviewed the postoperative management including pain management and his ability to get back to work. He does not work in a high risk profession such as the [employer] industry or scuba diving. Therefore I believe he could get back to work within 2 weeks of discharge. We will make a further assessment in the morning. If he continues to have an air leak and is desirous of surgical intervention we will try to perform this as soon as possible.      Steven Rivers  Steven Rivers 11/22/2014, 11:03 AM

## 2014-11-23 ENCOUNTER — Encounter
Admission: EM | Disposition: A | Discharge status: Home / Self Care | Payer: Self-pay | Source: Home / Self Care | Attending: Surgery

## 2014-11-23 ENCOUNTER — Inpatient Hospital Stay: Payer: Managed Care, Other (non HMO)

## 2014-11-23 SURGERY — VIDEO ASSISTED THORACOSCOPY (VATS)/THOROCOTOMY
Anesthesia: General | Laterality: Left

## 2014-11-23 NOTE — Progress Notes (Signed)
Patient ID: Steven Rivers, male   DOB: 1990/11/01, 24 y.o.   MRN: [id number]  HISTORY: This patient was seen today on morning rounds. At that time he complained of pain at the chest tube insertion site. However there is no air leak visible today.   PERTINENT REVIEW OF SYSTEMS: He denied any shortness of breath. He denied any fevers, cough or chills.  Filed Vitals:   11/23/14 1612  BP: 122/70  Pulse: 58  Temp: 98.2 F (36.8 C)  Resp: 20   Wt Readings from Last 3 Encounters:  11/20/14 58.196 kg (128 lb 4.8 oz)    EXAM:    Resp: Lungs are clear bilaterally.  No respiratory distress, normal effort. Heart:  Regular without murmurs Abd:  Abdomen is soft, non distended and non tender. No masses are palpable.  There is no rebound and no guarding.  Skin: Skin is warm and dry. No rash noted. No diaphoretic. No erythema. No pallor.  Psychiatric: Normal mood and affect. Normal behavior. Judgment and thought content normal.   No air leak seen in system even with vigorous cough     ASSESSMENT: I have independently reviewed the patient's CT scan. There is a small residual apical pneumothorax. There are apical blebs in the left lung. I again reviewed with him the options at this point in time. I believe he has a spontaneous pneumothorax secondary to bleb disease. Because there is no air leak he would like to hold off on surgery and I concur with that. We'll go ahead and allow the patient to eat today.   PLAN:   I will keep the patient's chest tube on suction today and place the tube to waterseal tomorrow. The tube may be removed tomorrow afternoon. I will obtain a chest x-ray once the tube is out.    Hulda Marinimothy Quron Ruddy, MD

## 2014-11-24 ENCOUNTER — Inpatient Hospital Stay: Payer: Managed Care, Other (non HMO)

## 2014-11-24 DIAGNOSIS — J939 Pneumothorax, unspecified: Secondary | ICD-10-CM

## 2014-11-24 MED ORDER — OXYCODONE-ACETAMINOPHEN 5-325 MG PO TABS: 1.0000 | ORAL_TABLET | Freq: Four times a day (QID) | ORAL | Status: DC | PRN

## 2014-11-24 NOTE — Progress Notes (Signed)
Patient ID: Steven Rivers, male   DOB: Aug 23, 1990, [age] y.o.   MRN: [id number]  HISTORY: Some pain at chest tube site.  Improving.  Not short of breath   PERTINENT REVIEW OF SYSTEMS: No shortness of breath.  No fever.\  Filed Vitals:   11/24/14 0819  BP: 112/68  Pulse: 69  Temp: 98.1 F (36.7 C)  Resp:    Wt Readings from Last 3 Encounters:  11/20/14 58.196 kg (128 lb 4.8 oz)    EXAM:  Resp: Lungs are clear bilaterally.  No respiratory distress, normal effort. Heart:  Regular without murmurs Abd:  Abdomen is soft, non distended and non tender. No masses are palpable.  There is no rebound and no guarding.  Psychiatric: Normal mood and affect. Normal behavior. Judgment and thought content normal.      ASSESSMENT: Independent review of CT and CXRay show a small apical pneumothorax on left.  No air leak with vigorous cough  PLAN:   I have clamped the chest tube and will obtain another chest xray in 4 hours.  Possible discharge today    Hulda Marinimothy Aspen Deterding, MD

## 2014-11-24 NOTE — Progress Notes (Signed)
Patient discharged to home as ordered, prescription and follow up appt given to patient as ordered. Patient is alert and oriented VSS No acute distress noted. Dr. Thelma Bargeaks followed up on XRay and release patient to be discharged to home.

## 2014-11-24 NOTE — Discharge Summary (Signed)
Physician Discharge Summary  Patient ID: Steven Rivers MRN: [id number] DOB/AGE: 07-11-1990 23 y.o.  Admit date: 11/19/2014 Discharge date: 11/24/2014  Admission Diagnoses:  Discharge Diagnoses:  Principal Problem:   Primary spontaneous pneumothorax Active Problems:   Pneumothorax    Hospital Course: Admitted with left spontaneous managed with chest tube.  CT showed a small left apical bleb.   Discharge Exam: Blood pressure 112/68, pulse 69, temperature 98.1 F (36.7 C), temperature source Oral, resp. rate 18, height 5\' 10"  (1.778 m), weight 58.196 kg (128 lb 4.8 oz), SpO2 100 %.   Disposition: Final discharge disposition not confirmed  Discharge Instructions    Call MD for:  difficulty breathing, headache or visual disturbances    Complete by:  As directed      Call MD for:  hives    Complete by:  As directed      Call MD for:  persistant dizziness or light-headedness    Complete by:  As directed      Call MD for:  persistant nausea and vomiting    Complete by:  As directed      Call MD for:  redness, tenderness, or signs of infection (pain, swelling, redness, odor or green/yellow discharge around incision site)    Complete by:  As directed      Call MD for:  severe uncontrolled pain    Complete by:  As directed      Call MD for:  temperature >100.4    Complete by:  As directed      Increase activity slowly    Complete by:  As directed      Remove dressing in 48 hours    Complete by:  As directed             Medication List    TAKE these medications        oxyCODONE-acetaminophen 5-325 MG per tablet  Commonly known as:  PERCOCET/ROXICET  Take 1-2 tablets by mouth every 6 (six) hours as needed for severe pain.           Follow-up Information    Follow up with [name], MD In 1 week.   Specialty:  Cardiothoracic Surgery   Contact information:   [address] [place] [id number] [phone]       Signed: Hulda Marinimothy Chandra Feger 11/24/2014, 1:28  PM

## 2014-12-01 ENCOUNTER — Other Ambulatory Visit: Payer: Self-pay | Admitting: Cardiothoracic Surgery

## 2014-12-01 DIAGNOSIS — S270XXD Traumatic pneumothorax, subsequent encounter: Secondary | ICD-10-CM

## 2014-12-02 ENCOUNTER — Ambulatory Visit
Admission: RE | Admit: 2014-12-02 | Discharge: 2014-12-02 | Disposition: A | Discharge status: Home / Self Care | Payer: Managed Care, Other (non HMO) | Source: Ambulatory Visit | Attending: Cardiothoracic Surgery | Admitting: Cardiothoracic Surgery

## 2014-12-02 ENCOUNTER — Inpatient Hospital Stay: Payer: Managed Care, Other (non HMO) | Attending: Cardiothoracic Surgery | Admitting: Cardiothoracic Surgery

## 2014-12-02 ENCOUNTER — Encounter: Payer: Self-pay | Admitting: Cardiothoracic Surgery

## 2014-12-02 VITALS — BP 123/74 | HR 92 | Temp 96.2°F | Resp 18 | Ht 70.0 in | Wt 127.0 lb

## 2014-12-02 DIAGNOSIS — J939 Pneumothorax, unspecified: Secondary | ICD-10-CM | POA: Diagnosis present

## 2014-12-02 DIAGNOSIS — Z09 Encounter for follow-up examination after completed treatment for conditions other than malignant neoplasm: Secondary | ICD-10-CM | POA: Diagnosis present

## 2014-12-02 DIAGNOSIS — J9383 Other pneumothorax: Secondary | ICD-10-CM

## 2014-12-02 DIAGNOSIS — S270XXD Traumatic pneumothorax, subsequent encounter: Secondary | ICD-10-CM
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
## 2015-10-30 IMAGING — CR DG CHEST 1V PORT
1 series · 1 of 1 positions shown · non-contrast
Comparison: 11/24/2014

CLINICAL DATA: Spontaneous pneumothorax this past [REDACTED]. Chest
tube placed.

EXAM:
PORTABLE CHEST - 1 VIEW

[ap]
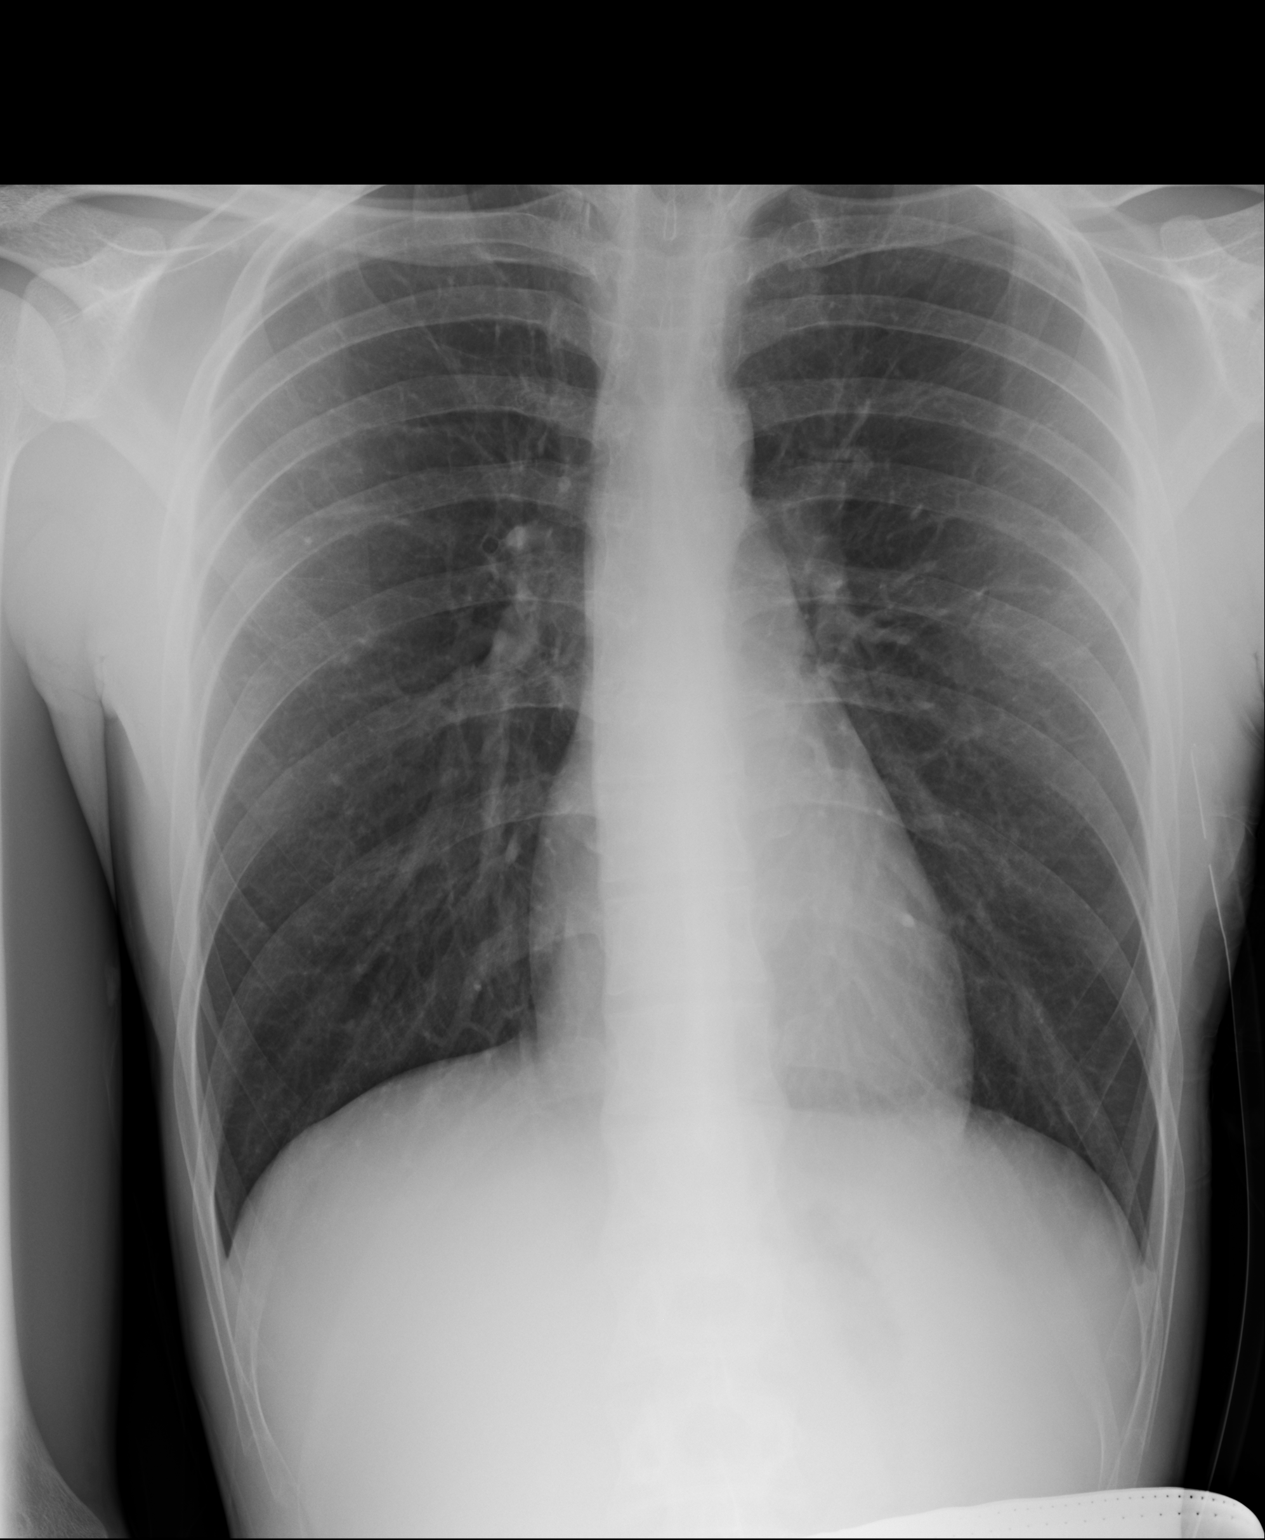

[1 of 1 positions shown; findings below may reference images not displayed]

FINDINGS: The left chest tube has for out of the left pleural space and is now
within the left lateral chest wall soft tissues. There is a small
left apical pneumothorax, approximately 5%, similar to prior study.
No focal airspace opacities within the lungs. No effusions. Heart is
normal size.
IMPRESSION: Stable small left pneumothorax of approximately 5%. The left chest
tube has pulled out of the pleural space and is located in the left
chest wall soft tissues.

These results will be called to the ordering clinician or
representative by the Radiologist Assistant, and communication
documented in the PACS or zVision Dashboard.

## 2017-04-14 ENCOUNTER — Emergency Department: Payer: Commercial Managed Care - PPO

## 2017-04-14 ENCOUNTER — Emergency Department
Admission: EM | Admit: 2017-04-14 | Discharge: 2017-04-14 | Disposition: A | Discharge status: Home / Self Care | Payer: Commercial Managed Care - PPO | Attending: Student in an Organized Health Care Education/Training Program | Admitting: Student in an Organized Health Care Education/Training Program

## 2017-04-14 ENCOUNTER — Encounter: Payer: Self-pay | Admitting: Emergency Medicine

## 2017-04-14 DIAGNOSIS — Y929 Unspecified place or not applicable: Secondary | ICD-10-CM | POA: Diagnosis not present

## 2017-04-14 DIAGNOSIS — Y999 Unspecified external cause status: Secondary | ICD-10-CM | POA: Insufficient documentation

## 2017-04-14 DIAGNOSIS — F17210 Nicotine dependence, cigarettes, uncomplicated: Secondary | ICD-10-CM | POA: Insufficient documentation

## 2017-04-14 DIAGNOSIS — S66195A Other injury of flexor muscle, fascia and tendon of left ring finger at wrist and hand level, initial encounter: Secondary | ICD-10-CM | POA: Insufficient documentation

## 2017-04-14 DIAGNOSIS — S66802A Unspecified injury of other specified muscles, fascia and tendons at wrist and hand level, left hand, initial encounter: Secondary | ICD-10-CM

## 2017-04-14 DIAGNOSIS — Y939 Activity, unspecified: Secondary | ICD-10-CM | POA: Diagnosis not present

## 2017-04-14 DIAGNOSIS — W230XXA Caught, crushed, jammed, or pinched between moving objects, initial encounter: Secondary | ICD-10-CM | POA: Insufficient documentation

## 2017-04-14 DIAGNOSIS — S6982XA Other specified injuries of left wrist, hand and finger(s), initial encounter: Secondary | ICD-10-CM | POA: Diagnosis present

## 2017-04-14 DIAGNOSIS — S61315A Laceration without foreign body of left ring finger with damage to nail, initial encounter: Secondary | ICD-10-CM | POA: Insufficient documentation

## 2017-04-14 MED ORDER — CEPHALEXIN 500 MG PO CAPS: 500.0000 mg | ORAL_CAPSULE | Freq: Three times a day (TID) | ORAL | 0 refills | Status: AC

## 2017-04-14 MED ORDER — BUPIVACAINE HCL (PF) 0.5 % IJ SOLN
INTRAMUSCULAR | Status: AC
  Filled 2017-04-14: qty 30

## 2017-04-14 MED ORDER — MORPHINE SULFATE (PF) 4 MG/ML IV SOLN
4.0000 mg | Freq: Once | INTRAVENOUS | Status: AC
  Administered 2017-04-14: 4 mg via INTRAMUSCULAR

## 2017-04-14 MED ORDER — OXYCODONE-ACETAMINOPHEN 5-325 MG PO TABS: 1.0000 | ORAL_TABLET | Freq: Four times a day (QID) | ORAL | 0 refills | Status: AC | PRN

## 2017-04-14 MED ORDER — TETANUS-DIPHTH-ACELL PERTUSSIS 5-2.5-18.5 LF-MCG/0.5 IM SUSP
0.5000 mL | Freq: Once | INTRAMUSCULAR | Status: DC
  Filled 2017-04-14: qty 0.5

## 2017-04-14 MED ORDER — BUPIVACAINE HCL 0.5 % IJ SOLN
50.0000 mL | Freq: Once | INTRAMUSCULAR | Status: AC
  Administered 2017-04-14: 30 mL

## 2017-04-14 MED ORDER — MORPHINE SULFATE (PF) 4 MG/ML IV SOLN
INTRAVENOUS | Status: AC
  Filled 2017-04-14: qty 1

## 2017-04-14 NOTE — ED Notes (Signed)
MD Roxan Hockeyobinson placed Xeroform, sterile gauze, and finger splint on patient's left 4th digit. This RN applied bandaid to distal left 5th digit

## 2017-04-14 NOTE — ED Notes (Signed)
Reviewed discharge instructions, follow-up care, prescriptions, wound care, dressing changes, splint care, and signs of infection with patient. Patient verbalized understanding.

## 2017-04-14 NOTE — ED Provider Notes (Signed)
[facility] Emergency Department Provider Note    First MD Initiated Contact with Patient 04/14/17 0030     (approximate)  I have reviewed the triage vital signs and the nursing notes.   HISTORY  Chief Complaint Laceration    HPI Steven Rivers is a 26 y.o. male presents with severe intractable pain to his left hand and fourth digit after he had his hand slammed in a closed house door.  Patient with obvious deformity to the distal aspect of his left hand.  Has severe pain but notes that he has loss of sensation distally to the laceration.  No history of bleeding disorders.  Does admit to drinking multiple alcoholic beverages tonight.  Denies any other injury.  Past Medical History:  Diagnosis Date  . Pneumothorax    Family History  Problem Relation Age of Onset  . Lung cancer Maternal Grandfather    History reviewed. No pertinent surgical history. Patient Active Problem List   Diagnosis Date Noted  . 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 Pneumothorax 11/19/2014      Prior to Admission medications   Not on File    Allergies Patient has no known allergies.    Social History Social History  Substance Use Topics  . Smoking status: Current Every Day Smoker    Packs/day: 0.50    Years: 5.00    Types: Cigarettes  . Smokeless tobacco: Former NeurosurgeonUser  . Alcohol use 1.2 oz/week    2 Cans of beer per week     Comment: Occassionally on weekends    Review of Systems Patient denies headaches, rhinorrhea, blurry vision, numbness, shortness of breath, chest pain, edema, cough, abdominal pain, nausea, vomiting, diarrhea, dysuria, fevers, rashes or hallucinations unless otherwise stated above in HPI. ____________________________________________   PHYSICAL EXAM:  VITAL SIGNS: Vitals:   04/14/17 0025  Pulse: (!) 128  Resp: 17  Temp: 99.1 F (37.3 C)  SpO2: 95%    Constitutional: Alert and oriented. In obvious discomfort Eyes:  Conjunctivae are normal.  Head: Atraumatic. Nose: No congestion/rhinnorhea. Mouth/Throat: Mucous membranes are moist.   Neck: No stridor. Painless ROM.  Cardiovascular: Normal rate, regular rhythm. Grossly normal heart sounds.  Good peripheral circulation. Respiratory: Normal respiratory effort.  No retractions. Lungs CTAB. Gastrointestinal: Soft and nontender. No distention. No abdominal bruits. No CVA tenderness. Musculoskeletal: No lower extremity tenderness nor edema.  No joint effusions. Neurologic:  Normal speech and language. No gross focal neurologic deficits are appreciated. No facial droop Skin: Obvious deformity and near complete avulsion/amputation of the distal aspect of his left fourth digit.  Laceration and avulsion seems to involve the pulp of the digit and does not involve the nailbed.  No foreign bodies.  Sensation is absent distal to the laceration.  Distal skin is currently pink but with delayed cap refill.  Does have a 1 cm superficial abrasion/laceration on the distal volar aspect of the left fifth digit. Psychiatric: Mood and affect are normal. Speech and behavior are normal.  ____________________________________________   LABS (all labs ordered are listed, but only abnormal results are displayed)  No results found for this or any previous visit (from the past 24 hour(s)). ____________________________________________   ____________________________________________  RADIOLOGY  I personally reviewed all radiographic images ordered to evaluate for the above acute complaints and reviewed radiology reports and findings.  These findings were personally discussed with the patient.  Please see medical record for radiology report.  ____________________________________________   PROCEDURES  Procedure(s) performed:  Marland Kitchen.Marland Kitchen.Laceration  Repair Date/Time: 04/14/2017 1:45 AM Performed by: Willy Eddy Authorized by: Willy Eddy   Consent:    Consent obtained:   Verbal   Consent given by:  Patient   Risks discussed:  Infection, need for additional repair, nerve damage, pain, poor cosmetic result, poor wound healing, retained foreign body, tendon damage and vascular damage Anesthesia (see MAR for exact dosages):    Anesthesia method:  Nerve block   Block anesthetic:  Bupivacaine 0.5% w/o epi   Block technique:  Ring block Laceration details:    Location:  Finger   Finger location:  L ring finger   Length (cm):  4   Depth (mm):  10 Repair type:    Repair type:  Complex Exploration:    Wound extent: fascia violated, nerve damage, tendon damage, underlying fracture and vascular damage     Wound extent: no areolar tissue violation noted, no foreign bodies/material noted and no muscle damage noted     Tendon damage location:  Upper extremity   Upper extremity tendon damage location:  Finger flexor   Finger flexor tendon:  Flexor digitorum profundus   Tendon damage extent:  Complete transection   Tendon repair plan:  Refer for evaluation Treatment:    Area cleansed with:  Hibiclens   Amount of cleaning:  Extensive   Irrigation solution:  Sterile saline   Irrigation method:  Pressure wash   Visualized foreign bodies/material removed: no   Subcutaneous repair:    Suture size:  5-0   Suture material:  Vicryl   Number of sutures:  5 Skin repair:    Repair method:  Sutures   Suture size:  5-0   Suture material:  Nylon   Number of sutures:  10 Approximation:    Approximation:  Close Post-procedure details:    Dressing:  Sterile dressing   Patient tolerance of procedure:  Tolerated well, no immediate complications       Critical Care performed: no ____________________________________________   INITIAL IMPRESSION / ASSESSMENT AND PLAN / ED COURSE  Pertinent labs & imaging results that were available during my care of the patient were reviewed by me and considered in my medical decision making (see chart for details).  DDX:  laceration, fracture, retained foreign body  Steven Rivers is a 26 y.o. who presents to the ED with laceration and fracture to left fourth digit.  Her laceration repaired as above.  Does have sensory deficit distal to the laceration as well as probably complete or near-complete injury to the FDP tendon.  Unable to reapproximate at bedside.  Patient will be referred to Ortho hand for delayed repair.  Have discussed with the patient and available family all diagnostics and treatments performed thus far and all questions were answered to the best of my ability. The patient demonstrates understanding and agreement with plan.       ____________________________________________   FINAL CLINICAL IMPRESSION(S) / ED DIAGNOSES  Final diagnoses:  Laceration of left ring finger without foreign body with damage to nail, initial encounter  Injury of flexor tendon of hand, left, initial encounter      NEW MEDICATIONS STARTED DURING THIS VISIT:  New Prescriptions   No medications on file     Note:  This document was prepared using Dragon voice recognition software and may include unintentional dictation errors.    Willy Eddy, MD 04/14/17 [phone]

## 2017-04-14 NOTE — ED Triage Notes (Signed)
Pt with significant laceration down center of 4th digit of left hand after having hand closed in house door; bleeding controlled; pt writhing in pain; taken to treatment room 11

## 2018-04-26 ENCOUNTER — Emergency Department
Admission: EM | Admit: 2018-04-26 | Discharge: 2018-04-27 | Disposition: A | Discharge status: Home / Self Care | Payer: Commercial Managed Care - PPO | Attending: Emergency Medicine | Admitting: Emergency Medicine

## 2018-04-26 ENCOUNTER — Encounter: Payer: Self-pay | Admitting: Emergency Medicine

## 2018-04-26 DIAGNOSIS — F10929 Alcohol use, unspecified with intoxication, unspecified: Secondary | ICD-10-CM

## 2018-04-26 DIAGNOSIS — F17210 Nicotine dependence, cigarettes, uncomplicated: Secondary | ICD-10-CM | POA: Insufficient documentation

## 2018-04-26 DIAGNOSIS — F10120 Alcohol abuse with intoxication, uncomplicated: Secondary | ICD-10-CM | POA: Insufficient documentation

## 2018-04-26 LAB — COMPREHENSIVE METABOLIC PANEL
ALBUMIN: 4.7 g/dL (ref 3.5–5.0)
ALK PHOS: 94 U/L (ref 38–126)
ALT: 24 U/L (ref 0–44)
ANION GAP: 11 (ref 5–15)
AST: 24 U/L (ref 15–41)
BUN: 7 mg/dL (ref 6–20)
CO2: 26 mmol/L (ref 22–32)
Calcium: 8.9 mg/dL (ref 8.9–10.3)
Chloride: 109 mmol/L (ref 98–111)
Creatinine, Ser: 0.82 mg/dL (ref 0.61–1.24)
GFR calc Af Amer: >60 mL/min (ref >60)
GFR calc non Af Amer: >60 mL/min (ref >60)
GLUCOSE: 134 mg/dL — AB (ref 70–99)
POTASSIUM: 3.6 mmol/L (ref 3.5–5.1)
SODIUM: 146 mmol/L — AB (ref 135–145)
Total Bilirubin: 0.6 mg/dL (ref 0.3–1.2)
Total Protein: 8 g/dL (ref 6.5–8.1)

## 2018-04-26 LAB — CBC WITH DIFFERENTIAL/PLATELET
ABS IMMATURE GRANULOCYTES: 0.07 10*3/uL (ref 0.00–0.07)
BASOS ABS: 0.1 10*3/uL (ref 0.0–0.1)
Basophils Relative: 1 %
EOS PCT: 2 %
Eosinophils Absolute: 0.1 10*3/uL (ref 0.0–0.5)
HCT: 44.3 % (ref 39.0–52.0)
HEMOGLOBIN: 15.5 g/dL (ref 13.0–17.0)
IMMATURE GRANULOCYTES: 1 %
Lymphocytes Relative: 35 %
Lymphs Abs: 2.8 10*3/uL (ref 0.7–4.0)
MCH: 32.1 pg (ref 26.0–34.0)
MCHC: 35 g/dL (ref 30.0–36.0)
MCV: 91.7 fL (ref 80.0–100.0)
Monocytes Absolute: 0.5 10*3/uL (ref 0.1–1.0)
Monocytes Relative: 7 %
NEUTROS ABS: 4.5 10*3/uL (ref 1.7–7.7)
NEUTROS PCT: 54 %
NRBC: 0 % (ref 0.0–0.2)
Platelets: 313 10*3/uL (ref 150–400)
RBC: 4.83 MIL/uL (ref 4.22–5.81)
RDW: 12.1 % (ref 11.5–15.5)
WBC: 8.1 10*3/uL (ref 4.0–10.5)

## 2018-04-26 LAB — CK: Total CK: 114 U/L (ref 49–397)

## 2018-04-26 LAB — ACETAMINOPHEN LEVEL: Acetaminophen (Tylenol), Serum: <10 ug/mL — ABNORMAL LOW (ref 10–30)

## 2018-04-26 LAB — ETHANOL: ALCOHOL ETHYL (B): 283 mg/dL — AB (ref <10)

## 2018-04-26 LAB — SALICYLATE LEVEL

## 2018-04-26 MED ORDER — LORAZEPAM 2 MG/ML IJ SOLN
1.0000 mg | Freq: Once | INTRAMUSCULAR | Status: AC
  Administered 2018-04-26: 1 mg via INTRAVENOUS
  Filled 2018-04-26: qty 1

## 2018-04-26 NOTE — ED Provider Notes (Signed)
[facility] Emergency Department Provider Note       Time seen: ----------------------------------------- 9:31 PM on 04/26/2018 -----------------------------------------   I have reviewed the triage vital signs and the nursing notes.  HISTORY   Chief Complaint No chief complaint on file.    HPI Steven Rivers is a 27 y.o. male with a history of spontaneous pneumothorax who presents to the ED for intoxication.  Patient was brought in by police after mom called police.  Patient was intoxicated on his front porch.  When police arrived he tried to run into traffic.  Police filled out involuntarily committment paperwork.  Past Medical History:  Diagnosis Date  . Pneumothorax     Patient Active Problem List   Diagnosis Date Noted  . 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 Pneumothorax 11/19/2014    No past surgical history on file.  Allergies Patient has no known allergies.  Social History Social History   Tobacco Use  . Smoking status: Current Every Day Smoker    Packs/day: 0.50    Years: 5.00    Pack years: 2.50    Types: Cigarettes  . Smokeless tobacco: Former Engineer, water Use Topics  . Alcohol use: Yes    Alcohol/week: 2.0 standard drinks    Types: 2 Cans of beer per week    Comment: Occassionally on weekends  . Drug use: Yes    Types: Marijuana   Review of Systems Constitutional: Negative for fever. Cardiovascular: Negative for chest pain. Respiratory: Negative for shortness of breath. Gastrointestinal: Negative for abdominal pain, vomiting and diarrhea. Musculoskeletal: Negative for back pain. Skin: Negative for rash. Neurological: Negative for headaches, focal weakness or numbness.  All systems negative/normal/unremarkable except as stated in the HPI  ____________________________________________   PHYSICAL EXAM:  VITAL SIGNS: ED Triage Vitals  Enc Vitals Group     BP      Pulse      Resp      Temp    Temp src      SpO2      Weight      Height      Head Circumference      Peak Flow      Pain Score      Pain Loc      Pain Edu?      Excl. in GC?    Constitutional: Alert, agitated and combative on arrival ENT   Head: Normocephalic and atraumatic.   Nose: No congestion/rhinnorhea.   Mouth/Throat: Mucous membranes are moist.   Neck: No stridor. Cardiovascular: Rapid rate, regular rhythm. No murmurs, rubs, or gallops. Respiratory: Normal respiratory effort without tachypnea nor retractions. Breath sounds are clear and equal bilaterally. No wheezes/rales/rhonchi. Gastrointestinal: Soft and nontender. Normal bowel sounds Musculoskeletal: Nontender with normal range of motion in extremities. No lower extremity tenderness nor edema. Neurologic:  Normal speech and language. No gross focal neurologic deficits are appreciated.  Skin: Abrasions are appreciated on the extremities Psychiatric: Elevated mood, combative ____________________________________________  ED COURSE:  As part of my medical decision making, I reviewed the following data within the electronic MEDICAL RECORD NUMBER History obtained from family if available, nursing notes, old chart and ekg, as well as notes from prior ED visits. Patient presented for agitation and intoxication, we will assess with labs as indicated Clinical Course as of Apr 27 2331  Sat Apr 26, 2018  2313 Alcohol, Ethyl (B)(!): 283 [JW]    Clinical Course User Index [JW] Emily Filbert, MD  Procedures ____________________________________________   LABS (pertinent positives/negatives)  Labs Reviewed  COMPREHENSIVE METABOLIC PANEL - Abnormal; Notable for the following components:      Result Value   Sodium 146 (*)    Glucose, Bld 134 (*)    All other components within normal limits  ACETAMINOPHEN LEVEL - Abnormal; Notable for the following components:   Acetaminophen (Tylenol), Serum <10 (*)    All other components within normal  limits  ETHANOL - Abnormal; Notable for the following components:   Alcohol, Ethyl (B) 283 (*)    All other components within normal limits  CBC WITH DIFFERENTIAL/PLATELET  CK  SALICYLATE LEVEL  URINE DRUG SCREEN, QUALITATIVE ([facility] ONLY)   ____________________________________________  DIFFERENTIAL DIAGNOSIS   Intoxication, substance use disorder, depression  FINAL ASSESSMENT AND PLAN  Alcohol intoxication   Plan: The patient had presented for alcohol intoxication with agitation. Patient's labs did reveal significant alcohol intoxication.  He was given 1 dose of Ativan and is currently resting comfortably.  He will likely be cleared by psychiatry when he is evaluated tomorrow.  I suspect this is all alcohol intoxication related.   Ulice Dash, MD   Note: This note was generated in part or whole with voice recognition software. Voice recognition is usually quite accurate but there are transcription errors that can and very often do occur. I apologize for any typographical errors that were not detected and corrected.     Emily Filbert, MD 04/26/18 [phone]

## 2018-04-26 NOTE — ED Notes (Signed)
Pt ate meal tray and cola, this RN talking to pt about POC (IV and blood work), and sports on TV.  After gaining consent to treat pt and starting IV in left forearm, pt then reported that this RN DID NOT obtain consent and pulled out IV.  Bandage applied.  Pt then became tearful about losing control of life.  This RN provided reassurances and attempted to coach pt to pursue a life in which pt was in control.  Pt then oriented to being IVC'd due to behavior.  Regardless this did obtain permission for a second IV start which was missed and then pt consented to a third attempt and subsequent blood draw - provided that no information went to the police "because fuck them police", stated pt.

## 2018-04-26 NOTE — ED Notes (Addendum)
Pt. Here via [employer] in forensic restraints and being verbally loud and verbally assaultive to officers and staff.  Per IVC, officer on scene took out IVC paperwork.  Pt. Tried to Jump out of 2nd story house and attempted to run out into traffic.  Pt. States drinking alcohol tonight.  IVC paperwork also stated pt. [place] Spaniel he wanted to end his life.

## 2018-04-26 NOTE — ED Notes (Signed)
Pt. Asked "Do you have suicidal thoughts?"  Pt. States "No"  Pt. Asked "Are having any homicidal thoughts?"  Pt. States "no".

## 2018-04-26 NOTE — ED Triage Notes (Signed)
Pt. Brought in by [employer] in forensic restraints.

## 2018-04-27 LAB — URINE DRUG SCREEN, QUALITATIVE (ARMC ONLY)
Amphetamines, Ur Screen: NOT DETECTED
Barbiturates, Ur Screen: NOT DETECTED
Benzodiazepine, Ur Scrn: NOT DETECTED
CANNABINOID 50 NG, UR ~~LOC~~: POSITIVE — AB
Cocaine Metabolite,Ur ~~LOC~~: NOT DETECTED
MDMA (Ecstasy)Ur Screen: NOT DETECTED
METHADONE SCREEN, URINE: NOT DETECTED
OPIATE, UR SCREEN: NOT DETECTED
Phencyclidine (PCP) Ur S: NOT DETECTED
TRICYCLIC, UR SCREEN: NOT DETECTED

## 2018-04-27 MED ORDER — NALTREXONE HCL 50 MG PO TABS: 50.0000 mg | ORAL_TABLET | Freq: Every day | ORAL | 0 refills | Status: DC

## 2018-04-27 NOTE — ED Notes (Addendum)
Pt. Changed out by this Clinical research associate.  Pt. Has no valuables to secure.  Pt. Has no observed weapons.  Pt. Has a pair of black boots, pair of gray sweat-pants, pair of blue boxers, grey sweat shirt, strip shirt and ID card put in sweat-pants.

## 2018-04-27 NOTE — ED Notes (Signed)
Bag labeled one of one for Steven Rivers removed from [employer] and placed at RN station for pt discharge.

## 2018-04-27 NOTE — ED Notes (Signed)
Nurse gave report to Dr. Fermin Schwab for [facility]

## 2018-04-27 NOTE — ED Notes (Signed)
Nurse talked to Patient and he is alert and oriented, little sleepy still, denies Si/hi or avh, He states that he does have a drinking problem and has been to AA , he is cooperative and calm, states " I hope to go home", nurse will continue to monitor.

## 2018-04-27 NOTE — ED Notes (Signed)
Patient is having SOC at this time.  

## 2018-04-27 NOTE — ED Notes (Signed)
Patients mother called to check on patient.  Mother([phone], stated pt. Recently moved back in with them due to losing job.  Mother states patient has had ongoing problem with alcohol.  States patient has had 3 DUI's in past 5 years and just recently went to court for last one.

## 2018-04-27 NOTE — ED Notes (Signed)
Patient voices understanding of discharge instructions, no signs of distress, prescription given to him and information for self help programs for alcoholism, all belongings given to Patient.

## 2018-04-27 NOTE — ED Notes (Signed)
Dr. Fermin Schwab called nurse to say that Patient would be discharged back home.

## 2018-04-27 NOTE — ED Notes (Signed)
Pt. Woke up and walked to bathroom this writer asked patient to give urine specimen.  Pt. Went to bathroom and gave urine specimen to this Clinical research associate.  Pt. Also requested and was given a cup of OJ.

## 2018-04-27 NOTE — ED Notes (Signed)
Patient is alert and oriented, no behavioral issues, no signs of distress, will continue to monitor.

## 2018-04-27 NOTE — ED Provider Notes (Signed)
-----------------------------------------   8:55 AM on 04/27/2018 -----------------------------------------   Blood pressure 134/88, pulse (!) 102, temperature (!) 97.5 F (36.4 C), temperature source Oral, resp. rate 20, height 5\' 11"  (1.803 m), weight 68 kg, SpO2 96 %.  The patient had no acute events since last update.  Calm and cooperative at this time.  No suicidal homicidal ideation.  Patient has been evaluated by the specialist on-call psychiatrist, Dr. Fermin Schwab.  Dr. Fermin Schwab recommends discharge to home and has taken the patient off commitment.  Recommends naltrexone 50 mg daily.  Patient would like to try this.  He denies taking any opiate pain medications and he is also negative for opiates in his urine.  Will be given referral to [facility] as well as [facility] for further counseling.  Patient understand the diagnosis as well as treatment and willing to comply.    Myrna Blazer, MD 04/27/18 [phone]

## 2018-04-27 NOTE — ED Notes (Signed)
Pt given breakfast tray. Pt stated that [facility] doctor told him he would be able to go home.

## 2018-04-27 NOTE — BH Assessment (Signed)
Assessment Note  Steven Rivers is an 27 y.o. male. Patient presents to [facility]ED under IVC via [facility] due to intoxication. Patient states he was drunk yesterday, but doesn't remember much else.Per EDP note, paitent was intoxicated on his front porch and tried to run into traffic. Patient states he is depressed because he recently loss his job and his girlfriend is stressful. Patient endorses alcohol use daily, drinking 5-6 beers. Patient currently denies SI/HI/AVH. Patient endorses depression.  Patient doesn't currently have any involvement in the legal system, but has a history of DUI's.  Patient doesn't have currently outpatient mental health providers or any previous inpatient psych history.   Patient presents oriented x 4, and cooperative with a depressed affect during assessment.   Diagnosis: Depression  Past Medical History:  Past Medical History:  Diagnosis Date  . Pneumothorax     History reviewed. No pertinent surgical history.  Family History:  Family History  Problem Relation Age of Onset  . Lung cancer Maternal Grandfather     Social History:  reports that he has been smoking cigarettes. He has a 2.50 pack-year smoking history. He has quit using smokeless tobacco. He reports that he drinks about 2.0 standard drinks of alcohol per week. He reports that he has current or past drug history. Drug: Marijuana.  Additional Social History:  Alcohol / Drug Use Pain Medications: SEE PTA  Prescriptions: SEE PTA  Over the Counter: SEE PTA  History of alcohol / drug use?: Yes Longest period of sobriety (when/how long): a couple of weeks  Negative Consequences of Use: Financial, Armed forces operational officer, Personal relationships, Work / School Substance #1 Name of Substance 1: ETOH  1 - Age of First Use: 20  1 - Amount (size/oz): 5-6 beers  1 - Frequency: daily  1 - Duration: ongoing  1 - Last Use / Amount: yesterday (04/27/2018)  CIWA: CIWA-Ar BP: 134/88 Pulse Rate: (!) 102 COWS:    Allergies:  No Known Allergies  Home Medications:  (Not in a hospital admission)  OB/GYN Status:  No LMP for male patient.  General Assessment Data Assessment unable to be completed: (Assessment completed) Location of Assessment: [facility] ED TTS Assessment: In system Is this a Tele or Face-to-Face Assessment?: Face-to-Face Is this an Initial Assessment or a Re-assessment for this encounter?: Initial Assessment Patient Accompanied by:: Parent Language Other than English: No Living Arrangements: Other (Comment)(with mother) What gender do you identify as?: Male Marital status: Single Maiden name: N/A Pregnancy Status: No Living Arrangements: Parent Can pt return to current living arrangement?: Yes Admission Status: Involuntary Petitioner: Police Is patient capable of signing voluntary admission?: Yes Referral Source: Self/Family/Friend Insurance type: UHC   Medical Screening Exam [facility] Walk-in ONLY) Medical Exam completed: Yes  Crisis Care Plan Living Arrangements: Parent Legal Guardian: Other:(None reported) Name of Psychiatrist: N/A Name of Therapist: N/A  Education Status Is patient currently in school?: No Is the patient employed, unemployed or receiving disability?: Unemployed  Risk to self with the past 6 months Suicidal Ideation: No Has patient been a risk to self within the past 6 months prior to admission? : No Suicidal Intent: No Has patient had any suicidal intent within the past 6 months prior to admission? : No Is patient at risk for suicide?: No Suicidal Plan?: No Has patient had any suicidal plan within the past 6 months prior to admission? : No Access to Means: No What has been your use of drugs/alcohol within the last 12 months?: ETOH  Previous Attempts/Gestures: No How  many times?: 0 Other Self Harm Risks: Ongoing substance use  Triggers for Past Attempts: Other (Comment)(None reported ) Intentional Self Injurious Behavior: None Family Suicide History:  No Recent stressful life event(s): Job Loss Persecutory voices/beliefs?: No Depression: Yes Depression Symptoms: Feeling worthless/self pity, Feeling angry/irritable, Tearfulness Substance abuse history and/or treatment for substance abuse?: Yes Suicide prevention information given to non-admitted patients: Not applicable  Risk to Others within the past 6 months Homicidal Ideation: No Does patient have any lifetime risk of violence toward others beyond the six months prior to admission? : No Thoughts of Harm to Others: No Current Homicidal Intent: No Current Homicidal Plan: No Access to Homicidal Means: No Identified Victim: None reported History of harm to others?: No Assessment of Violence: None Noted Violent Behavior Description: None reported Does patient have access to weapons?: No Criminal Charges Pending?: No Does patient have a court date: No Is patient on probation?: No  Psychosis Hallucinations: None noted Delusions: None noted  Mental Status Report Appearance/Hygiene: In hospital gown Eye Contact: Fair Motor Activity: Unremarkable Speech: Unremarkable Level of Consciousness: Alert Mood: Depressed Affect: Depressed Anxiety Level: None Thought Processes: Circumstantial Judgement: Impaired Orientation: Person, Place, Time, Situation, Appropriate for developmental age Obsessive Compulsive Thoughts/Behaviors: None  Cognitive Functioning Concentration: Normal Memory: Recent Impaired, Remote Intact Is patient IDD: No Insight: Fair Impulse Control: Poor Appetite: Fair Have you had any weight changes? : No Change Sleep: No Change Total Hours of Sleep: 7 Vegetative Symptoms: None  ADLScreening [facility] Assessment Services) Patient's cognitive ability adequate to safely complete daily activities?: Yes Patient able to express need for assistance with ADLs?: Yes Independently performs ADLs?: Yes (appropriate for developmental age)  Prior Inpatient Therapy Prior  Inpatient Therapy: No  Prior Outpatient Therapy Prior Outpatient Therapy: No Does patient have an ACCT team?: No Does patient have Intensive In-House Services?  : No Does patient have [facility] services? : No Does patient have P4CC services?: No  ADL Screening (condition at time of admission) Patient's cognitive ability adequate to safely complete daily activities?: Yes Is the patient deaf or have difficulty hearing?: No Does the patient have difficulty seeing, even when wearing glasses/contacts?: No Does the patient have difficulty concentrating, remembering, or making decisions?: No Patient able to express need for assistance with ADLs?: Yes Does the patient have difficulty dressing or bathing?: No Independently performs ADLs?: Yes (appropriate for developmental age) Does the patient have difficulty walking or climbing stairs?: No Weakness of Legs: None Weakness of Arms/Hands: None  Home Assistive Devices/Equipment Home Assistive Devices/Equipment: None  Therapy Consults (therapy consults require a physician order) PT Evaluation Needed: No OT Evalulation Needed: No SLP Evaluation Needed: No Abuse/Neglect Assessment (Assessment to be complete while patient is alone) Abuse/Neglect Assessment Can Be Completed: Yes Physical Abuse: Denies Verbal Abuse: Denies Sexual Abuse: Denies Exploitation of patient/patient's resources: Denies Self-Neglect: Denies Values / Beliefs Cultural Requests During Hospitalization: None Spiritual Requests During Hospitalization: None Consults Spiritual Care Consult Needed: No Social Work Consult Needed: No            Disposition:  Disposition Initial Assessment Completed for this Encounter: Yes Patient referred to: Other (Comment)(N/A)  On Site Evaluation by:   Reviewed with Physician:    Galen Manila, LPC, LCASA 04/27/2018 9:41 AM

## 2018-10-21 ENCOUNTER — Other Ambulatory Visit: Payer: Self-pay

## 2018-10-21 ENCOUNTER — Emergency Department
Admission: EM | Admit: 2018-10-21 | Discharge: 2018-10-21 | Disposition: A | Discharge status: Home / Self Care | Payer: BLUE CROSS/BLUE SHIELD | Attending: Emergency Medicine | Admitting: Emergency Medicine

## 2018-10-21 ENCOUNTER — Encounter: Payer: Self-pay | Admitting: Emergency Medicine

## 2018-10-21 DIAGNOSIS — W228XXA Striking against or struck by other objects, initial encounter: Secondary | ICD-10-CM | POA: Diagnosis not present

## 2018-10-21 DIAGNOSIS — Y999 Unspecified external cause status: Secondary | ICD-10-CM | POA: Insufficient documentation

## 2018-10-21 DIAGNOSIS — H18892 Other specified disorders of cornea, left eye: Secondary | ICD-10-CM

## 2018-10-21 DIAGNOSIS — T1592XA Foreign body on external eye, part unspecified, left eye, initial encounter: Secondary | ICD-10-CM

## 2018-10-21 DIAGNOSIS — T1502XA Foreign body in cornea, left eye, initial encounter: Secondary | ICD-10-CM | POA: Insufficient documentation

## 2018-10-21 DIAGNOSIS — Y9389 Activity, other specified: Secondary | ICD-10-CM | POA: Insufficient documentation

## 2018-10-21 DIAGNOSIS — Z79899 Other long term (current) drug therapy: Secondary | ICD-10-CM | POA: Diagnosis not present

## 2018-10-21 DIAGNOSIS — Y929 Unspecified place or not applicable: Secondary | ICD-10-CM | POA: Insufficient documentation

## 2018-10-21 DIAGNOSIS — F17210 Nicotine dependence, cigarettes, uncomplicated: Secondary | ICD-10-CM | POA: Insufficient documentation

## 2018-10-21 MED ORDER — TETRACAINE HCL 0.5 % OP SOLN
2.0000 [drp] | Freq: Once | OPHTHALMIC | Status: AC
  Administered 2018-10-21: 2 [drp] via OPHTHALMIC
  Filled 2018-10-21: qty 4

## 2018-10-21 MED ORDER — FLUORESCEIN SODIUM 1 MG OP STRP
1.0000 | ORAL_STRIP | Freq: Once | OPHTHALMIC | Status: AC
  Administered 2018-10-21: 1 via OPHTHALMIC
  Filled 2018-10-21: qty 1

## 2018-10-21 MED ORDER — CIPROFLOXACIN HCL 0.3 % OP SOLN: 1.0000 [drp] | OPHTHALMIC | 0 refills | Status: AC

## 2018-10-21 NOTE — Discharge Instructions (Addendum)
You still have residual rust to your eye where the piece of metal was removed.  This will need to be removed by ophthalmology.  Your eye will not feel better until this is removed.  Please call ophthalmology for an appointment.  Please begin antibiotic eyedrops.

## 2018-10-21 NOTE — ED Notes (Signed)
See triage note  States he thinks he has gotten something in left eye last pm  Left ey red and irritated

## 2018-10-21 NOTE — ED Triage Notes (Signed)
Pt states he felt something in his left eye around 1230am while in bed, states he was cutting metal yesterday. Left eye appears red.

## 2018-10-21 NOTE — ED Provider Notes (Signed)
[facility] Emergency Department Provider Note  ____________________________________________  Time seen: Approximately 1:45 PM  I have reviewed the triage vital signs and the nursing notes.   HISTORY  Chief Complaint Eye Pain    HPI Steven Rivers is a 28 y.o. male that presents to the emergency department for evaluation of metal eye foreign body to left eye.  Patient states that he was trying to cut rusty nails yesterday when metal hit his eye.  This is happened in the past to his right eye.  Pain is giving him a headache.   Past Medical History:  Diagnosis Date  . Pneumothorax     Patient Active Problem List   Diagnosis Date Noted  . 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 Pneumothorax 11/19/2014    History reviewed. No pertinent surgical history.  Prior to Admission medications   Medication Sig Start Date End Date Taking? Authorizing Provider  ciprofloxacin (CILOXAN) 0.3 % ophthalmic solution Place 1 drop into the left eye every 2 (two) hours for 5 days. Administer 1 drop, every 2 hours, while awake, for 2 days. Then 1 drop, every 4 hours, while awake, for the next 5 days. 10/21/18 10/26/18  Enid Derry, PA-C  naltrexone (DEPADE) 50 MG tablet Take 1 tablet (50 mg total) by mouth daily. 04/27/18   Schaevitz, Myra Rude, MD    Allergies Patient has no known allergies.  Family History  Problem Relation Age of Onset  . Lung cancer Maternal Grandfather     Social History Social History   Tobacco Use  . Smoking status: Current Every Day Smoker    Packs/day: 0.50    Years: 5.00    Pack years: 2.50    Types: Cigarettes  . Smokeless tobacco: Former Engineer, water Use Topics  . Alcohol use: Yes    Alcohol/week: 2.0 standard drinks    Types: 2 Cans of beer per week    Comment: Occassionally on weekends  . Drug use: Yes    Types: Marijuana     Review of Systems  Gastrointestinal: No nausea, no vomiting.  Musculoskeletal:  Negative for musculoskeletal pain. Skin: Negative for rash, abrasions, lacerations, ecchymosis. Neurological: Positive for headache.   ____________________________________________   PHYSICAL EXAM:  VITAL SIGNS: ED Triage Vitals  Enc Vitals Group     BP 10/21/18 1130 (!) 116/101     Pulse Rate 10/21/18 1130 96     Resp 10/21/18 1130 16     Temp 10/21/18 1130 97.8 F (36.6 C)     Temp Source 10/21/18 1130 Oral     SpO2 10/21/18 1130 97 %     Weight 10/21/18 1131 150 lb (68 kg)     Height 10/21/18 1131 5\' 11"  (1.803 m)     Head Circumference --      Peak Flow --      Pain Score 10/21/18 1132 7     Pain Loc --      Pain Edu? --      Excl. in GC? --      Constitutional: Alert and oriented. Well appearing and in no acute distress. Eyes: Conjunctivae are normal. PERRL. EOMI. Metallic foreign body to 9 PM position of iris.    Visual Acuity  Right Eye Distance: 20/20 Left Eye Distance: 20/40 Bilateral Distance: 20/20  Right Eye Near:   Left Eye Near:    Bilateral Near:      Head: Atraumatic. ENT:      Ears:  Nose: No congestion/rhinnorhea.      Mouth/Throat: Mucous membranes are moist.  Neck: No stridor.  Cardiovascular: Normal rate, regular rhythm.  Good peripheral circulation. Respiratory: Normal respiratory effort without tachypnea or retractions. Lungs CTAB. Good air entry to the bases with no decreased or absent breath sounds. Musculoskeletal: Full range of motion to all extremities. No gross deformities appreciated. Neurologic:  Normal speech and language. No gross focal neurologic deficits are appreciated.  Skin:  Skin is warm, dry and intact. No rash noted. Psychiatric: Mood and affect are normal. Speech and behavior are normal. Patient exhibits appropriate insight and judgement.   ____________________________________________   LABS (all labs ordered are listed, but only abnormal results are displayed)  Labs Reviewed - No data to  display ____________________________________________  EKG   ____________________________________________  RADIOLOGY No results found.  ____________________________________________    PROCEDURES  Procedure(s) performed:    .Foreign Body Removal Date/Time: 10/21/2018 1:47 PM Performed by: Enid DerryWagner, Lemya Greenwell, PA-C Authorized by: Enid DerryWagner, Ernesta Trabert, PA-C  Consent: Verbal consent obtained. Risks and benefits: risks, benefits and alternatives were discussed Consent given by: patient Patient understanding: patient states understanding of the procedure being performed Body area: eye Location details: left cornea  Anesthesia: Local Anesthetic: tetracaine drops Localization method: visualized Removal mechanism: 25-gauge needle Corneal abrasion size: small Corneal abrasion location: medial Residual rust ring present. Dressing: antibiotic drops Depth: embedded Complexity: complex 1 objects recovered. Objects recovered: metal  Post-procedure assessment: residual foreign bodies remain Patient tolerance: Patient tolerated the procedure well with no immediate complications      Medications  tetracaine (PONTOCAINE) 0.5 % ophthalmic solution 2 drop (2 drops Left Eye Given by Other 10/21/18 1354)  fluorescein ophthalmic strip 1 strip (1 strip Left Eye Given 10/21/18 1354)     ____________________________________________   INITIAL IMPRESSION / ASSESSMENT AND PLAN / ED COURSE  Pertinent labs & imaging results that were available during my care of the patient were reviewed by me and considered in my medical decision making (see chart for details).  Review of the [place] CSRS was performed in accordance of the [facility] prior to dispensing any controlled drugs.     Patient presented emergency department for evaluation of metal foreign body.  Vital signs and exam are reassuring.  Metal foreign body was removed.  Residual rust ring remains.  Patient is agreeable to follow-up with ophthalmology  for rust ring removal.  Patient will be discharged home with prescriptions for cipro drops. Patient is to follow up with opthalmology as directed. Referal was given to Dr. Inez PilgrimBrasington. Patient is given ED precautions to return to the ED for any worsening or new symptoms.     ____________________________________________  FINAL CLINICAL IMPRESSION(S) / ED DIAGNOSES  Final diagnoses:  Foreign body of left eye, initial encounter  Corneal rust ring of left eye      NEW MEDICATIONS STARTED DURING THIS VISIT:  ED Discharge Orders         Ordered    ciprofloxacin (CILOXAN) 0.3 % ophthalmic solution  Every 2 hours     10/21/18 1355              This chart was dictated using voice recognition software/Dragon. Despite best efforts to proofread, errors can occur which can change the meaning. Any change was purely unintentional.    Enid DerryWagner, Abdon Petrosky, PA-C 10/21/18 1556    Emily FilbertWilliams, Jonathan E, MD 10/21/18 [phone]

## 2021-04-24 ENCOUNTER — Other Ambulatory Visit: Payer: Self-pay

## 2021-04-24 ENCOUNTER — Emergency Department: Payer: Self-pay

## 2021-04-24 ENCOUNTER — Emergency Department
Admission: EM | Admit: 2021-04-24 | Discharge: 2021-04-24 | Disposition: A | Discharge status: Home / Self Care | Payer: Self-pay | Attending: Emergency Medicine | Admitting: Emergency Medicine

## 2021-04-24 ENCOUNTER — Encounter: Payer: Self-pay | Admitting: Emergency Medicine

## 2021-04-24 DIAGNOSIS — X500XXA Overexertion from strenuous movement or load, initial encounter: Secondary | ICD-10-CM | POA: Insufficient documentation

## 2021-04-24 DIAGNOSIS — R52 Pain, unspecified: Secondary | ICD-10-CM

## 2021-04-24 DIAGNOSIS — Y990 Civilian activity done for income or pay: Secondary | ICD-10-CM | POA: Insufficient documentation

## 2021-04-24 DIAGNOSIS — S92425A Nondisplaced fracture of distal phalanx of left great toe, initial encounter for closed fracture: Secondary | ICD-10-CM

## 2021-04-24 MED ORDER — HYDROCODONE-ACETAMINOPHEN 5-325 MG PO TABS: 1.0000 | ORAL_TABLET | ORAL | 0 refills | Status: DC | PRN

## 2021-04-24 NOTE — ED Notes (Signed)
Left great toe is discolored, painful. Patient walked with limping gait.

## 2021-04-24 NOTE — ED Provider Notes (Signed)
[facility] Emergency Department Provider Note  Time seen: 2:20 PM  I have reviewed the triage vital signs and the nursing notes.   HISTORY  Chief Complaint Toe Injury   HPI Steven Rivers is a 30 y.o. male with no significant past medical history presents to the emergency department pain.  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  Patient has been experiencing pain to the right great toe with bruising.  States the pain is worse when he walks on the foot.  No other injury.   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 No pertinent surgical history.  Prior to Admission medications   Medication Sig Start Date End Date Taking? Authorizing Provider  naltrexone (DEPADE) 50 MG tablet Take 1 tablet (50 mg total) by mouth daily. 04/27/18   Schaevitz, Myra Rude, MD    No Known Allergies  Family History  Problem Relation Age of Onset   Lung cancer Maternal Grandfather     Social History Social History   Tobacco Use   Smoking status: Every Day    Packs/day: 0.50    Years: 5.00    Pack years: 2.50    Types: Cigarettes   Smokeless tobacco: Former  Substance Use Topics   Alcohol use: Yes    Alcohol/week: 2.0 standard drinks    Types: 2 Cans of beer per week    Comment: Occassionally on weekends   Drug use: Yes    Types: Marijuana    Review of Systems Constitutional: Negative for fever. Cardiovascular: Negative for chest pain. Respiratory: Negative for shortness of breath. Musculoskeletal: Right great toe pain Skin: Bruising to the left great toe and right second toe Neurological: Negative for headache All other ROS negative  ____________________________________________   PHYSICAL EXAM:  VITAL SIGNS: ED Triage Vitals   Enc Vitals Group     BP 04/24/21 1301 126/75     Pulse Rate 04/24/21 1301 70     Resp 04/24/21 1301 20     Temp 04/24/21 1301 97.9 F (36.6 C)     Temp Source 04/24/21 1301 Oral     SpO2 04/24/21 1301 100 %     Weight 04/24/21 1205 149 lb 14.6 oz (68 kg)     Height 04/24/21 1205 5\' 11"  (1.803 m)     Head Circumference --      Peak Flow --      Pain Score 04/24/21 1203 0     Pain Loc --      Pain Edu? --      Excl. in GC? --     Constitutional: Alert and oriented. Well appearing and in no distress. Eyes: Normal exam ENT      Head: Normocephalic and atraumatic.      Mouth/Throat: Mucous membranes are moist. Cardiovascular: Normal rate, regular rhythm. Respiratory: Normal respiratory effort without tachypnea nor retractions. Breath sounds are clear  Gastrointestinal: Soft and nontender. No distention.   Musculoskeletal: Patient has mild edema of mild to moderate bruising of the first and second toes on the left side.  Neuro vastly intact. Neurologic:  Normal speech and language. No gross focal neurologic deficits  Skin:  Skin is warm, dry and intact.  Psychiatric: Mood and affect are normal.  ____________________________________________   RADIOLOGY  X-ray consistent with nondisplaced distal tuft fracture of the distal phalanx of the first toe  ____________________________________________   INITIAL IMPRESSION / ASSESSMENT AND PLAN / ED COURSE  Pertinent labs & imaging results that were available during my care of the patient were reviewed by me and considered in my medical decision making (see chart for details).   X-ray consistent with tuft fracture of the distal phalanx of the first.  No other injuries on exam.  We will place the patient in a postoperative shoe and discharged with pain medication.  We will write for orthopedic follow-up.  Patient agreeable to plan of care  RISHAWN Rivers was evaluated in Emergency Department on 04/24/2021 for the symptoms described in  the history of present illness. He was evaluated in the context of the global COVID-19 pandemic, which necessitated consideration that the patient might be at risk for infection with the SARS-CoV-2 virus that causes COVID-19. Institutional protocols and algorithms that pertain to the evaluation of patients at risk for COVID-19 are in a state of rapid change based on information released by regulatory bodies including the CDC and federal and state organizations. These policies and algorithms were followed during the patient's care in the ED.  ____________________________________________   FINAL CLINICAL IMPRESSION(S) / ED DIAGNOSES  Toe fracture   Minna Antis, MD 04/24/21 1423

## 2021-04-24 NOTE — ED Triage Notes (Signed)
C/O left great toe injury.  Injured toe on Friday, states a forklift rolled over foot.

## 2021-10-31 ENCOUNTER — Encounter: Payer: Self-pay | Admitting: Emergency Medicine

## 2021-10-31 ENCOUNTER — Ambulatory Visit
Admission: EM | Admit: 2021-10-31 | Discharge: 2021-10-31 | Disposition: A | Discharge status: Home / Self Care | Payer: Self-pay | Attending: Physician Assistant | Admitting: Physician Assistant

## 2021-10-31 DIAGNOSIS — S39012A Strain of muscle, fascia and tendon of lower back, initial encounter: Secondary | ICD-10-CM

## 2021-10-31 DIAGNOSIS — M545 Low back pain, unspecified: Secondary | ICD-10-CM

## 2021-10-31 MED ORDER — CYCLOBENZAPRINE HCL 10 MG PO TABS: 10.0000 mg | ORAL_TABLET | Freq: Two times a day (BID) | ORAL | 0 refills | Status: AC | PRN

## 2021-10-31 MED ORDER — NAPROXEN 500 MG PO TABS: 500.0000 mg | ORAL_TABLET | Freq: Two times a day (BID) | ORAL | 0 refills | Status: DC

## 2021-10-31 NOTE — Discharge Instructions (Signed)

## 2021-10-31 NOTE — ED Provider Notes (Signed)
?[facility] ? ? ? ?CSN: [id number] ?Arrival date & time: 10/31/21  0957 ? ? ?  ? ?History   ?Chief Complaint ?Chief Complaint  ?Patient presents with  ? Back Pain  ? ? ?HPI ?Steven Rivers is a 31 y.o. male presenting for approximately 1 week history of left sided lower back pain.  Patient says he has a constant moderate aching pain of his low back.  He says the pain does not radiate to his abdomen or lower extremities.  No associated numbness, weakness or tingling.  Patient denies any known injury but states he does a lot of bending and lifting for his job.  Patient reports increased pain with rotation at hips or forward flexing.  Slightly improved symptoms when he extends his back.  Patient has tried taking Tylenol without improvement in his pain.  He says it has not gotten any better or worse.  He is denying any urinary symptoms or history of kidney stones.  Denies any upper back pain, chest pain or pleuritic pain. ? ?HPI ? ?Past Medical History:  ?Diagnosis Date  ? Pneumothorax   ? ? ?Patient Active Problem List  ? Diagnosis Date Noted  ? 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 Pneumothorax 11/19/2014  ? ? ?History reviewed. No pertinent surgical history. ? ? ? ? ?Home Medications   ? ?Prior to Admission medications   ?Medication Sig Start Date End Date Taking? Authorizing Provider  ?cyclobenzaprine (FLEXERIL) 10 MG tablet Take 1 tablet (10 mg total) by mouth 2 (two) times daily as needed for up to 7 days for muscle spasms. 10/31/21 11/07/21 Yes Eusebio Friendly B, PA-C  ?naproxen (NAPROSYN) 500 MG tablet Take 1 tablet (500 mg total) by mouth 2 (two) times daily. 10/31/21  Yes Shirlee Latch, PA-C  ? ? ?Family History ?Family History  ?Problem Relation Age of Onset  ? Lung cancer Maternal Grandfather   ? ? ?Social History ?Social History  ? ?Tobacco Use  ? Smoking status: Every Day  ?  Packs/day: 0.50  ?  Years: 5.00  ?  Pack years: 2.50  ?  Types: Cigarettes  ? Smokeless tobacco: Former  ?Substance  Use Topics  ? Alcohol use: Yes  ?  Alcohol/week: 2.0 standard drinks  ?  Types: 2 Cans of beer per week  ?  Comment: Occassionally on weekends  ? Drug use: Yes  ?  Types: Marijuana  ? ? ? ?Allergies   ?Patient has no known allergies. ? ? ?Review of Systems ?Review of Systems  ?Constitutional:  Negative for fatigue and fever.  ?Respiratory:  Negative for shortness of breath.   ?Cardiovascular:  Negative for chest pain.  ?Gastrointestinal:  Negative for abdominal pain.  ?Genitourinary:  Negative for difficulty urinating, dysuria, flank pain and hematuria.  ?Musculoskeletal:  Positive for back pain. Negative for gait problem.  ?Skin:  Negative for rash.  ?Neurological:  Negative for weakness and numbness.  ? ? ?Physical Exam ?Triage Vital Signs ?ED Triage Vitals  ?Enc Vitals Group  ?   BP 10/31/21 1124 (!) 127/98  ?   Pulse Rate 10/31/21 1124 99  ?   Resp 10/31/21 1124 18  ?   Temp 10/31/21 1124 98.2 ?F (36.8 ?C)  ?   Temp Source 10/31/21 1124 Oral  ?   SpO2 10/31/21 1124 100 %  ?   Weight 10/31/21 1121 154 lb 15.7 oz (70.3 kg)  ?   Height 10/31/21 1121 5\' 11"  (1.803 m)  ?  Head Circumference --   ?   Peak Flow --   ?   Pain Score 10/31/21 1121 7  ?   Pain Loc --   ?   Pain Edu? --   ?   Excl. in GC? --   ? ?No data found. ? ?Updated Vital Signs ?BP (!) 127/98 (BP Location: Left Arm)   Pulse 99   Temp 98.2 ?F (36.8 ?C) (Oral)   Resp 18   Ht 5\' 11"  (1.803 m)   Wt 154 lb 15.7 oz (70.3 kg)   SpO2 100%   BMI 21.62 kg/m?  ?  ? ?Physical Exam ?Vitals and nursing note reviewed.  ?Constitutional:   ?   General: He is not in acute distress. ?   Appearance: Normal appearance. He is well-developed. He is not ill-appearing.  ?HENT:  ?   Head: Normocephalic and atraumatic.  ?Eyes:  ?   General: No scleral icterus. ?   Conjunctiva/sclera: Conjunctivae normal.  ?Cardiovascular:  ?   Rate and Rhythm: Normal rate and regular rhythm.  ?   Heart sounds: Normal heart sounds.  ?Pulmonary:  ?   Effort: Pulmonary effort is normal. No  respiratory distress.  ?   Breath sounds: Normal breath sounds.  ?Abdominal:  ?   Palpations: Abdomen is soft.  ?   Tenderness: There is no abdominal tenderness. There is no right CVA tenderness or left CVA tenderness.  ?Musculoskeletal:  ?   Cervical back: Neck supple.  ?   Lumbar back: Tenderness present. No swelling, deformity or bony tenderness. Decreased range of motion (slightly with forward flexion). Negative right straight leg raise test and negative left straight leg raise test.  ?     Back: ? ?   Comments: TTP left lumbar muscles as shown in picture.   ?Skin: ?   General: Skin is warm and dry.  ?   Capillary Refill: Capillary refill takes less than 2 seconds.  ?Neurological:  ?   General: No focal deficit present.  ?   Mental Status: He is alert. Mental status is at baseline.  ?   Motor: No weakness.  ?   Coordination: Coordination normal.  ?   Gait: Gait normal.  ?Psychiatric:     ?   Mood and Affect: Mood normal.     ?   Behavior: Behavior normal.     ?   Thought Content: Thought content normal.  ? ? ? ?UC Treatments / Results  ?Labs ?(all labs ordered are listed, but only abnormal results are displayed) ?Labs Reviewed - No data to display ? ?EKG ? ? ?Radiology ?No results found. ? ?Procedures ?Procedures (including critical care time) ? ?Medications Ordered in UC ?Medications - No data to display ? ?Initial Impression / Assessment and Plan / UC Course  ?I have reviewed the triage vital signs and the nursing notes. ? ?Pertinent labs & imaging results that were available during my care of the patient were reviewed by me and considered in my medical decision making (see chart for details). ? ?31 year old male presenting for left low back pain for the past 1 week.  Denies specific injury but does report a lot of repetitive bending and lifting.  No radiation of pain to lower extremities or URI symptoms.  No red flag signs or symptoms.  Taking Tylenol without improvement in symptoms.  Clinical presentation  consistent with lumbar strain/sprain.  We will treat at this time with anti-inflammatory medicine in addition to the Tylenol.  Sent  naproxen and cyclobenzaprine.  Also advised stretches, use of heat and lidocaine patches.  Reviewed return and ED precautions and provided him with a work note. ? ? ?Final Clinical Impressions(s) / UC Diagnoses  ? ?Final diagnoses:  ?Strain of lumbar region, initial encounter  ?Acute left-sided low back pain without sciatica  ? ? ? ?Discharge Instructions   ? ?  ?BACK PAIN: Stressed avoiding painful activities . RICE (REST, ICE, COMPRESSION, ELEVATION) guidelines reviewed. May alternate ice and heat. Consider use of muscle rubs, Salonpas patches, etc. Use medications as directed including muscle relaxers if prescribed. Take anti-inflammatory medications as prescribed or OTC NSAIDs/Tylenol.  F/u with PCP in 7-10 days for reexamination, and please feel free to call or return to the urgent care at any time for any questions or concerns you may have and we will be happy to help you!  ? ?BACK PAIN RED FLAGS: If the back pain acutely worsens or there are any red flag symptoms such as numbness/tingling, leg weakness, saddle anesthesia, or loss of bowel/bladder control, go immediately to the ER. Follow up with [place] as scheduled or sooner if the pain does not begin to resolve or if it worsens before the follow up   ? ? ? ? ?ED Prescriptions   ? ? Medication Sig Dispense Auth. Provider  ? cyclobenzaprine (FLEXERIL) 10 MG tablet Take 1 tablet (10 mg total) by mouth 2 (two) times daily as needed for up to 7 days for muscle spasms. 14 tablet Eusebio Friendly B, PA-C  ? naproxen (NAPROSYN) 500 MG tablet Take 1 tablet (500 mg total) by mouth 2 (two) times daily. 30 tablet Shirlee Latch, PA-C  ? ?  ? ?PDMP not reviewed this encounter. ?  ?Shirlee Latch, PA-C ?10/31/21 1206 ? ?

## 2021-10-31 NOTE — ED Triage Notes (Signed)
Pt reports left lower back pain x 1 week. States he does a lot of strenuous work with his job and believes it may be a pulled muscle.  ?

## 2022-03-30 IMAGING — CR DG FOOT COMPLETE 3+V*L*
3 series · 3 of 3 positions shown · non-contrast
Comparison: None.

CLINICAL DATA: Left first toe injury a few days prior

EXAM:
LEFT FOOT - COMPLETE 3+ VIEW

[foot ap]
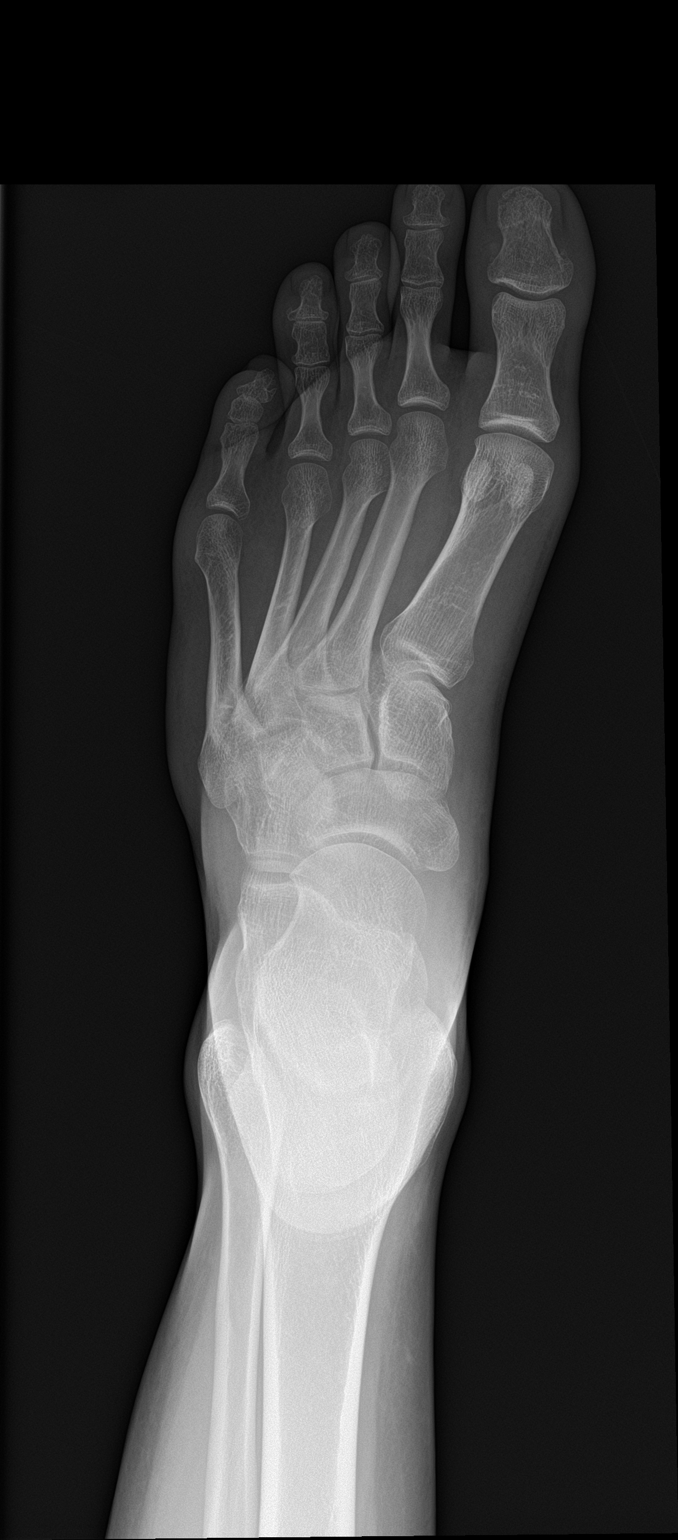

[foot obl]
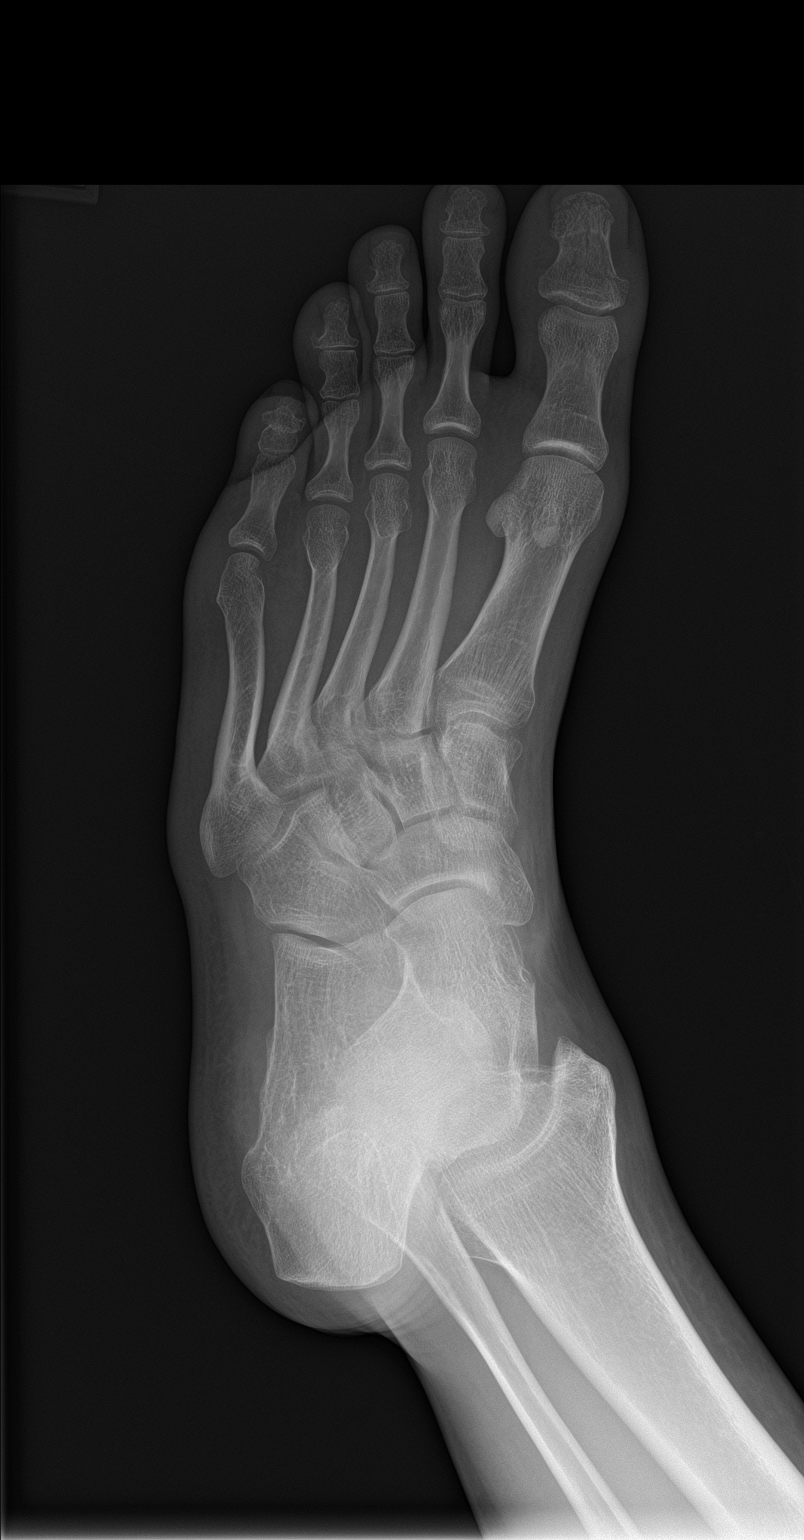

[foot lat]
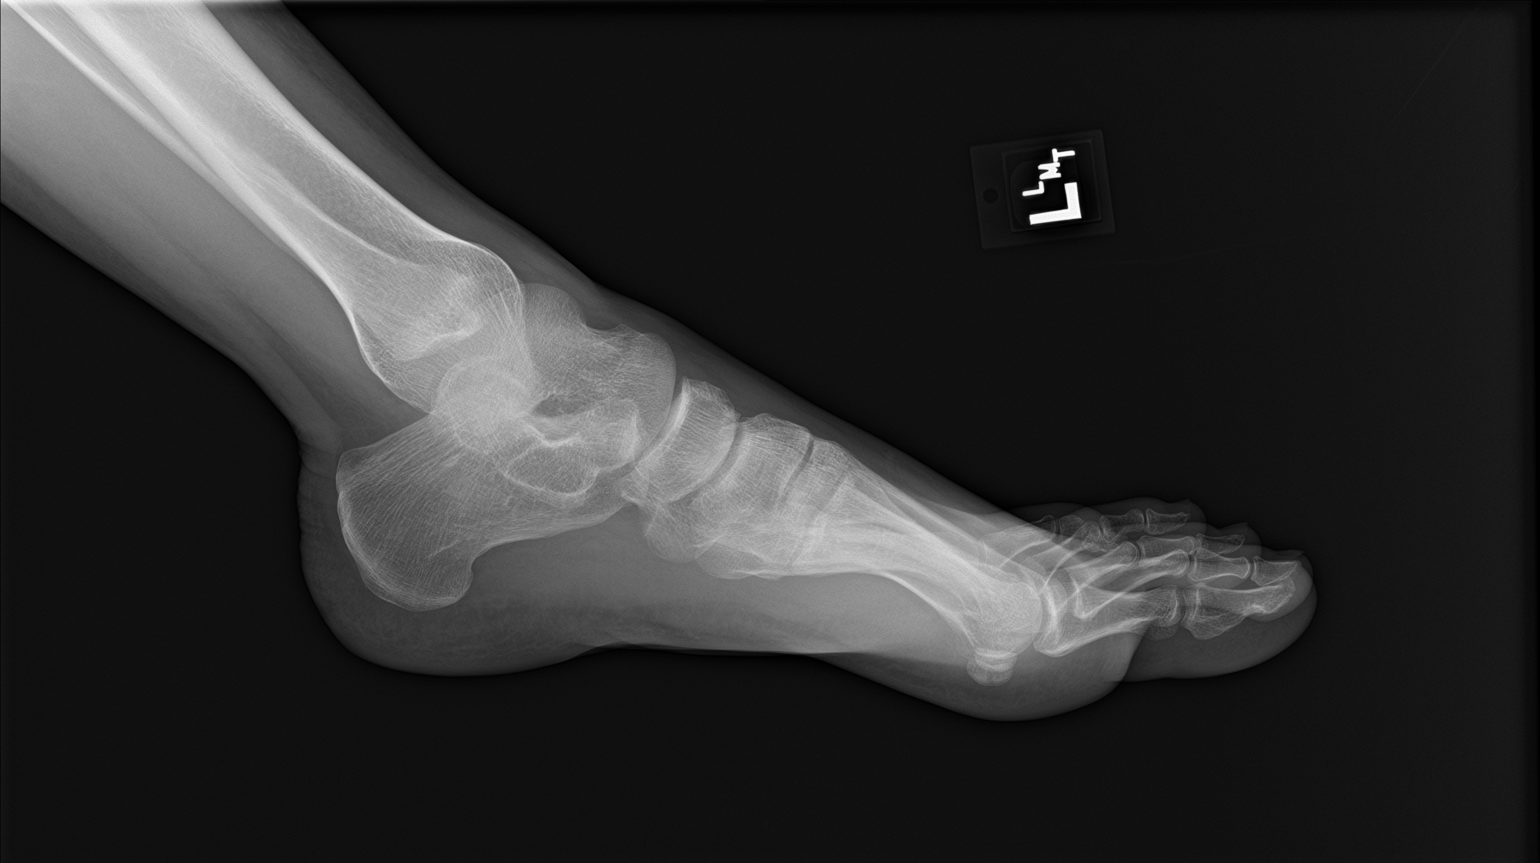

[3 of 3 positions shown; findings below may reference images not displayed]

FINDINGS: Nondisplaced distal tuft fracture in the distal phalanx left first
toe. No additional fractures. No dislocation. No suspicious focal
osseous lesions. Lisfranc joint appears intact. No radiopaque
foreign bodies. No significant arthropathy.
IMPRESSION: Nondisplaced distal tuft fracture in the distal phalanx left first
toe.

## 2023-05-26 DEATH — deceased
